# Patient Record
Sex: Female | Born: 1935 | Race: White | Hispanic: No | Marital: Married | Smoking: Never smoker
Health system: Southern US, Community
[De-identification: ages and names within clinical notes are randomized; demographics above are authoritative.]

## PROBLEM LIST (undated history)

## (undated) DIAGNOSIS — G2581 Restless legs syndrome: Secondary | ICD-10-CM

## (undated) DIAGNOSIS — D469 Myelodysplastic syndrome, unspecified: Secondary | ICD-10-CM

## (undated) DIAGNOSIS — H269 Unspecified cataract: Secondary | ICD-10-CM

## (undated) DIAGNOSIS — K3189 Other diseases of stomach and duodenum: Secondary | ICD-10-CM

## (undated) DIAGNOSIS — K760 Fatty (change of) liver, not elsewhere classified: Secondary | ICD-10-CM

## (undated) DIAGNOSIS — IMO0001 Reserved for inherently not codable concepts without codable children: Secondary | ICD-10-CM

## (undated) DIAGNOSIS — Z5189 Encounter for other specified aftercare: Secondary | ICD-10-CM

## (undated) DIAGNOSIS — I2510 Atherosclerotic heart disease of native coronary artery without angina pectoris: Secondary | ICD-10-CM

## (undated) DIAGNOSIS — R011 Cardiac murmur, unspecified: Secondary | ICD-10-CM

## (undated) DIAGNOSIS — K7460 Unspecified cirrhosis of liver: Secondary | ICD-10-CM

## (undated) DIAGNOSIS — E669 Obesity, unspecified: Secondary | ICD-10-CM

## (undated) DIAGNOSIS — K279 Peptic ulcer, site unspecified, unspecified as acute or chronic, without hemorrhage or perforation: Secondary | ICD-10-CM

## (undated) DIAGNOSIS — K219 Gastro-esophageal reflux disease without esophagitis: Secondary | ICD-10-CM

## (undated) DIAGNOSIS — K317 Polyp of stomach and duodenum: Secondary | ICD-10-CM

## (undated) DIAGNOSIS — D649 Anemia, unspecified: Secondary | ICD-10-CM

## (undated) DIAGNOSIS — M8580 Other specified disorders of bone density and structure, unspecified site: Secondary | ICD-10-CM

## (undated) DIAGNOSIS — I340 Nonrheumatic mitral (valve) insufficiency: Secondary | ICD-10-CM

## (undated) DIAGNOSIS — I509 Heart failure, unspecified: Secondary | ICD-10-CM

## (undated) DIAGNOSIS — H3530 Unspecified macular degeneration: Secondary | ICD-10-CM

## (undated) DIAGNOSIS — I10 Essential (primary) hypertension: Secondary | ICD-10-CM

## (undated) DIAGNOSIS — N189 Chronic kidney disease, unspecified: Secondary | ICD-10-CM

## (undated) DIAGNOSIS — E039 Hypothyroidism, unspecified: Secondary | ICD-10-CM

## (undated) DIAGNOSIS — R161 Splenomegaly, not elsewhere classified: Secondary | ICD-10-CM

## (undated) DIAGNOSIS — K766 Portal hypertension: Secondary | ICD-10-CM

## (undated) DIAGNOSIS — I8501 Esophageal varices with bleeding: Secondary | ICD-10-CM

## (undated) DIAGNOSIS — E785 Hyperlipidemia, unspecified: Secondary | ICD-10-CM

## (undated) HISTORY — PX: NEPHRECTOMY: SHX65

## (undated) HISTORY — DX: Obesity, unspecified: E66.9

## (undated) HISTORY — PX: CARDIAC CATHETERIZATION: SHX172

## (undated) HISTORY — DX: Unspecified cataract: H26.9

## (undated) HISTORY — DX: Polyp of stomach and duodenum: K31.7

## (undated) HISTORY — DX: Anemia, unspecified: D64.9

## (undated) HISTORY — DX: Essential (primary) hypertension: I10

## (undated) HISTORY — DX: Hypothyroidism, unspecified: E03.9

## (undated) HISTORY — DX: Hyperlipidemia, unspecified: E78.5

## (undated) HISTORY — DX: Fatty (change of) liver, not elsewhere classified: K76.0

## (undated) HISTORY — DX: Other specified disorders of bone density and structure, unspecified site: M85.80

## (undated) HISTORY — PX: PARTIAL NEPHRECTOMY: SHX414

## (undated) HISTORY — PX: OTHER SURGICAL HISTORY: SHX169

## (undated) HISTORY — DX: Unspecified cirrhosis of liver: K74.60

## (undated) HISTORY — PX: TONSILLECTOMY: SUR1361

## (undated) HISTORY — DX: Splenomegaly, not elsewhere classified: R16.1

## (undated) HISTORY — PX: BACK SURGERY: SHX140

## (undated) HISTORY — PX: ADRENALECTOMY: SHX876

## (undated) HISTORY — DX: Restless legs syndrome: G25.81

## (undated) HISTORY — PX: EYE SURGERY: SHX253

## (undated) HISTORY — DX: Atherosclerotic heart disease of native coronary artery without angina pectoris: I25.10

## (undated) HISTORY — DX: Chronic kidney disease, unspecified: N18.9

## (undated) HISTORY — DX: Nonrheumatic mitral (valve) insufficiency: I34.0

## (undated) HISTORY — DX: Peptic ulcer, site unspecified, unspecified as acute or chronic, without hemorrhage or perforation: K27.9

---
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
## 1958-08-27 HISTORY — PX: UMBILICAL HERNIA REPAIR: SHX196

## 1964-08-27 HISTORY — PX: TOTAL ABDOMINAL HYSTERECTOMY: SHX209

## 1968-08-27 HISTORY — PX: CHOLECYSTECTOMY: SHX55

## 1978-08-27 DIAGNOSIS — E785 Hyperlipidemia, unspecified: Secondary | ICD-10-CM

## 1978-08-27 HISTORY — DX: Hyperlipidemia, unspecified: E78.5

## 1983-08-28 DIAGNOSIS — E039 Hypothyroidism, unspecified: Secondary | ICD-10-CM

## 1983-08-28 HISTORY — DX: Hypothyroidism, unspecified: E03.9

## 1989-08-27 DIAGNOSIS — I10 Essential (primary) hypertension: Secondary | ICD-10-CM

## 1989-08-27 HISTORY — DX: Essential (primary) hypertension: I10

## 2001-06-27 DIAGNOSIS — I2510 Atherosclerotic heart disease of native coronary artery without angina pectoris: Secondary | ICD-10-CM

## 2001-06-27 HISTORY — DX: Atherosclerotic heart disease of native coronary artery without angina pectoris: I25.10

## 2001-08-01 ENCOUNTER — Inpatient Hospital Stay (HOSPITAL_COMMUNITY): Admission: RE | Admit: 2001-08-01 | Discharge: 2001-08-06 | Payer: Self-pay | Admitting: Cardiology

## 2001-08-01 ENCOUNTER — Encounter: Payer: Self-pay | Admitting: Cardiology

## 2002-02-23 ENCOUNTER — Ambulatory Visit (HOSPITAL_COMMUNITY): Admission: RE | Admit: 2002-02-23 | Discharge: 2002-02-23 | Payer: Self-pay | Admitting: Internal Medicine

## 2002-08-25 ENCOUNTER — Inpatient Hospital Stay (HOSPITAL_COMMUNITY): Admission: EM | Admit: 2002-08-25 | Discharge: 2002-08-28 | Payer: Self-pay

## 2002-08-27 DIAGNOSIS — R161 Splenomegaly, not elsewhere classified: Secondary | ICD-10-CM

## 2002-08-27 HISTORY — DX: Splenomegaly, not elsewhere classified: R16.1

## 2002-09-18 ENCOUNTER — Encounter: Admission: RE | Admit: 2002-09-18 | Discharge: 2002-09-18 | Payer: Self-pay | Admitting: Family Medicine

## 2002-09-29 ENCOUNTER — Inpatient Hospital Stay (HOSPITAL_COMMUNITY): Admission: AD | Admit: 2002-09-29 | Discharge: 2002-10-03 | Payer: Self-pay | Admitting: Family Medicine

## 2003-05-13 ENCOUNTER — Ambulatory Visit (HOSPITAL_COMMUNITY): Admission: RE | Admit: 2003-05-13 | Discharge: 2003-05-13 | Payer: Self-pay | Admitting: Gastroenterology

## 2004-07-27 DIAGNOSIS — G2581 Restless legs syndrome: Secondary | ICD-10-CM

## 2004-07-27 HISTORY — DX: Restless legs syndrome: G25.81

## 2005-08-27 DIAGNOSIS — M8580 Other specified disorders of bone density and structure, unspecified site: Secondary | ICD-10-CM

## 2005-08-27 HISTORY — DX: Other specified disorders of bone density and structure, unspecified site: M85.80

## 2006-06-05 ENCOUNTER — Ambulatory Visit: Payer: Self-pay | Admitting: Internal Medicine

## 2006-06-28 ENCOUNTER — Ambulatory Visit (HOSPITAL_COMMUNITY): Admission: RE | Admit: 2006-06-28 | Discharge: 2006-06-28 | Payer: Self-pay | Admitting: Internal Medicine

## 2006-06-28 ENCOUNTER — Ambulatory Visit: Payer: Self-pay | Admitting: Internal Medicine

## 2006-07-08 LAB — LACTATE DEHYDROGENASE: LDH: 132 U/L (ref 94–250)

## 2006-07-08 LAB — CBC WITH DIFFERENTIAL/PLATELET
Eosinophils Absolute: 0 10*3/uL (ref 0.0–0.5)
HCT: 32.1 % — ABNORMAL LOW (ref 34.8–46.6)
LYMPH%: 18.9 % (ref 14.0–48.0)
MONO#: 0.3 10*3/uL (ref 0.1–0.9)
NEUT#: 2 10*3/uL (ref 1.5–6.5)
NEUT%: 71 % (ref 39.6–76.8)
Platelets: 69 10*3/uL — ABNORMAL LOW (ref 145–400)
RBC: 3.19 10*6/uL — ABNORMAL LOW (ref 3.70–5.32)
WBC: 2.8 10*3/uL — ABNORMAL LOW (ref 3.9–10.0)
lymph#: 0.5 10*3/uL — ABNORMAL LOW (ref 0.9–3.3)

## 2008-07-02 HISTORY — PX: UPPER GASTROINTESTINAL ENDOSCOPY: SHX188

## 2009-02-11 HISTORY — PX: UPPER GASTROINTESTINAL ENDOSCOPY: SHX188

## 2010-10-11 ENCOUNTER — Institutional Professional Consult (permissible substitution) (INDEPENDENT_AMBULATORY_CARE_PROVIDER_SITE_OTHER): Payer: Medicare Other | Admitting: Cardiology

## 2010-10-11 DIAGNOSIS — R011 Cardiac murmur, unspecified: Secondary | ICD-10-CM

## 2010-10-11 DIAGNOSIS — I509 Heart failure, unspecified: Secondary | ICD-10-CM

## 2010-10-11 DIAGNOSIS — E119 Type 2 diabetes mellitus without complications: Secondary | ICD-10-CM

## 2010-10-25 ENCOUNTER — Other Ambulatory Visit (HOSPITAL_COMMUNITY): Payer: Medicare Other

## 2010-11-06 ENCOUNTER — Ambulatory Visit (HOSPITAL_COMMUNITY): Payer: Medicare Other | Attending: Cardiology

## 2010-11-06 DIAGNOSIS — I519 Heart disease, unspecified: Secondary | ICD-10-CM | POA: Insufficient documentation

## 2010-11-06 DIAGNOSIS — E785 Hyperlipidemia, unspecified: Secondary | ICD-10-CM | POA: Insufficient documentation

## 2010-11-06 DIAGNOSIS — E119 Type 2 diabetes mellitus without complications: Secondary | ICD-10-CM | POA: Insufficient documentation

## 2010-11-06 DIAGNOSIS — I509 Heart failure, unspecified: Secondary | ICD-10-CM | POA: Insufficient documentation

## 2010-11-06 DIAGNOSIS — I059 Rheumatic mitral valve disease, unspecified: Secondary | ICD-10-CM | POA: Insufficient documentation

## 2010-11-06 DIAGNOSIS — N289 Disorder of kidney and ureter, unspecified: Secondary | ICD-10-CM | POA: Insufficient documentation

## 2010-11-06 DIAGNOSIS — R0602 Shortness of breath: Secondary | ICD-10-CM | POA: Insufficient documentation

## 2010-11-13 ENCOUNTER — Ambulatory Visit (INDEPENDENT_AMBULATORY_CARE_PROVIDER_SITE_OTHER): Payer: Self-pay | Admitting: Internal Medicine

## 2010-11-21 ENCOUNTER — Ambulatory Visit (INDEPENDENT_AMBULATORY_CARE_PROVIDER_SITE_OTHER): Payer: Medicare Other | Admitting: Internal Medicine

## 2010-11-21 DIAGNOSIS — R188 Other ascites: Secondary | ICD-10-CM

## 2010-12-03 NOTE — Consult Note (Signed)
NAME:  Sara [name], Sara [name]             ACCOUNT NO.:  [id number]  MEDICAL RECORD NO.:  [id number]           PATIENT TYPE:  LOCATION:                                 FACILITY:  PHYSICIAN:  Lionel December, M.D.    DATE OF BIRTH:  August 29, 1924  DATE OF CONSULTATION: DATE OF DISCHARGE:                                CONSULTATION   HISTORY OF PRESENT ILLNESS:  Sara [name] is an 77 year old female referred to our office by Belva Agee at [facility] for ascites.  Sara [name] is actually a patient of ours and her last visit was in August 2011. Sara [name] was seen by me for followup of her recent GI bleed.  Sara [name] was admitted to [facility] and was transfused.  Her hemoglobin on April 11, 2010, was 10.6.  Sara [name] presents today with complaints of her ankle swelling and abdominal swelling.  Sara [name] recently underwent an ultrasound of the abdomen which revealed a stable moderate upper abdominal ascites.  Stable cirrhosis with splenomegaly and interval 1.2 cm probable superior splenic hemangioma or hemorrhagic cyst.  Her weight on October 02, 2009, was 196, today her weight is 218.  On April 13, 2010, her weight was 186.  Sara [name] has a history of cirrhosis and also Sara [name] has a history of esophageal varices secondary to cirrhosis, possibly due to a fatty liver.  Sara [name] underwent an EGD in June 2010.  Sara [name] had three columns of grade 2 esophageal varices without stigmata of bleeding.  They were slightly more prominent compared to previous exam of November 2009.  Recent labs that Sara [name] has had her BUN elevated at 55, her creatinine at 2.0.  Sodium 143, K 4.3, chloride 107, calcium 8.7 and BMP 112.7.  Sara [name] says her appetite is okay.  Her bowel movements are normal.  There has been no change in her appetite.  Shakeila also has a history of anemia associated with myelodysplastic syndrome.  Sara [name] is allergic to NSAIDS, SULFA and ASPIRIN.  HOME MEDICATIONS: 1. Lantus 25 units a day. 2. Clonidine 0.3 mg twice a day. 3.  Sara [name] is on Lasix 40 mg twice a day. 4. Amlodipine 10 mg a day. 5. Allopurinol 300 mg a day. 6. Simvastatin 10 mg a day. 7. Levothyroxine 200 mcg a day. 8. Sara [name] is on vitamin B12 1000 mcg. 9. PreserVision twice a day. 10.Fish oil 1000 mg a day. 11.Prilosec OTC 1 a day. 12.Glyburide 4 mg twice a day. 13.D3 1 a day. 14.MVI 1 a day. 15.Sara [name] is also on Coreg 25 mg twice a day.  OBJECTIVE:  HEENT:  Her conjunctivae are pink.  Her sclerae are anicteric.  Her thyroid is normal. NECK:  There is no cervical lymphadenopathy. LUNGS:  Clear. HEART:  Regular rate and rhythm. ABDOMEN:  Distended but not tense. EXTREMITIES:  Sara [name] has 4+ edema to her lower extremities and appears Sara [name] has some cellulitis too.  ASSESSMENT:  Jaylenne is an 77 year old female with nonalcoholic cirrhosis.  Sara [name] does have some distention today.  Her abdomen is not tense.  Sara [name] is on Lasix 40 mg b.i.d.  I did discuss this case with Dr. Karilyn Cota.  Would like to get  a Chem 20 on her today, CBC, PT/INR and alpha- fetoprotein.  If her BUN and creatinine are normal, we will start her on spirolactone 50 mg twice a day.  I will give her a prescription for this.  Sara [name] is not to take it till after I get the lab works back.  Sara [name] is to stay on the Lasix twice a day.  Further recommendations once I have her lab work back and Sara [name] will follow up in 3 months.    ______________________________ Dorene Ar, NP   ______________________________ Lionel December, M.D.    TS/MEDQ  D:  11/22/2010  T:  11/23/2010  Job:  [id number]  Electronically Signed by Dorene Ar PA on 11/29/2010 04:51:15 PM Electronically Signed by Lionel December M.D. on 12/03/2010 01:56:53 PM

## 2010-12-26 ENCOUNTER — Ambulatory Visit (INDEPENDENT_AMBULATORY_CARE_PROVIDER_SITE_OTHER): Payer: Medicare Other | Admitting: Internal Medicine

## 2011-01-12 ENCOUNTER — Encounter: Payer: Self-pay | Admitting: Nurse Practitioner

## 2011-02-13 ENCOUNTER — Other Ambulatory Visit (INDEPENDENT_AMBULATORY_CARE_PROVIDER_SITE_OTHER): Payer: Self-pay | Admitting: Internal Medicine

## 2011-02-13 ENCOUNTER — Ambulatory Visit (INDEPENDENT_AMBULATORY_CARE_PROVIDER_SITE_OTHER): Payer: Medicare Other | Admitting: Internal Medicine

## 2011-02-13 DIAGNOSIS — E8779 Other fluid overload: Secondary | ICD-10-CM

## 2011-02-13 DIAGNOSIS — K7460 Unspecified cirrhosis of liver: Secondary | ICD-10-CM

## 2011-02-13 DIAGNOSIS — K921 Melena: Secondary | ICD-10-CM

## 2011-02-19 LAB — CBC AND DIFFERENTIAL
Hemoglobin: 9 g/dL — AB (ref 12.0–16.0)
Platelets: 50 10*3/uL — AB (ref 150–399)

## 2011-03-25 LAB — HEPATIC FUNCTION PANEL
ALT: 11 U/L (ref 7–35)
AST: 13 U/L (ref 13–35)

## 2011-04-11 ENCOUNTER — Encounter (INDEPENDENT_AMBULATORY_CARE_PROVIDER_SITE_OTHER): Payer: Self-pay

## 2011-06-04 ENCOUNTER — Ambulatory Visit (INDEPENDENT_AMBULATORY_CARE_PROVIDER_SITE_OTHER): Payer: Medicare Other | Admitting: Internal Medicine

## 2011-06-04 ENCOUNTER — Encounter (INDEPENDENT_AMBULATORY_CARE_PROVIDER_SITE_OTHER): Payer: Self-pay | Admitting: Internal Medicine

## 2011-06-04 DIAGNOSIS — K7460 Unspecified cirrhosis of liver: Secondary | ICD-10-CM

## 2011-06-04 DIAGNOSIS — D649 Anemia, unspecified: Secondary | ICD-10-CM

## 2011-06-04 NOTE — Progress Notes (Signed)
Presenting complaint; followup for cirrhosis and anemia with history of GI bleed; Subjective; patient is 77 year old Caucasian female who is therefore scheduled visit. She was last seen on 02/14/2011. She states she is doing well. She just had her 86th birthday yesterday. She has gained 5 pounds since her last visit. She denies abdominal distention but has noted no edema to her lower extremities. He denies PND or orthopnea. She has a good appetite. Her bowels move daily. She denies melena or rectal bleeding nausea vomiting or heartburn. She checks her blood glucose level once or twice daily and her fasting is low 100 range. She says her last hemoglobin A1c was 5.2%. She is still driving. He has macular degeneration in her condition actually has improved Current Outpatient Prescriptions  Medication Sig Dispense Refill  . allopurinol (ZYLOPRIM) 100 MG tablet Take 300 mg by mouth. Take 1/2 tablet daily      . amLODipine (NORVASC) 10 MG tablet Take 10 mg by mouth daily.        . carvedilol (COREG) 25 MG tablet Take 25 mg by mouth 2 (two) times daily with a meal.        . Cholecalciferol (VITAMIN D) 2000 UNITS CAPS Take by mouth every evening.        . cloNIDine (CATAPRES) 0.3 MG tablet Take 0.3 mg by mouth 2 (two) times daily.        . folic acid (FOLVITE) 1 MG tablet Take 1 mg by mouth every morning.        . furosemide (LASIX) 40 MG tablet Take 40 mg by mouth daily.       Marland Kitchen glimepiride (AMARYL) 4 MG tablet Take 4 mg by mouth daily before breakfast.        . insulin glargine (LANTUS) 100 UNIT/ML injection Inject 20 Units into the skin at bedtime.       Marland Kitchen levothyroxine (SYNTHROID, LEVOTHROID) 200 MCG tablet Take 200 mcg by mouth daily.        . Multiple Vitamins-Minerals (CENTRUM SILVER PO) Take by mouth every morning.        . Multiple Vitamins-Minerals (PRESERVISION AREDS PO) Take by mouth 3 (three) times daily - between meals and at bedtime.        . Omega-3 Fatty Acids (FISH OIL) 1000 MG CAPS Take  by mouth every morning.        Marland Kitchen omeprazole (PRILOSEC OTC) 20 MG tablet Take 20 mg by mouth daily.        . simvastatin (ZOCOR) 20 MG tablet Take 10 mg by mouth. Take 1/2 tablet at bedtime      . vitamin B-12 (CYANOCOBALAMIN) 1000 MCG tablet Take 1,000 mcg by mouth every morning.         Objective BP 124/70  Pulse 76  Temp(Src) 97.3 F (36.3 C) (Oral)  Resp 14  Ht 5\' 3"  (1.6 m)  Wt 230 lb (104.327 kg)  BMI 40.74 kg/m2 She is alert and does not have asterixis. Conjunctiva is pink. Sclerae nonicteric. Oropharyngeal mucosa is normal. No neck masses or thyromegaly noted. Cardiac exam with regular rhythm normal S1 and S2. He has grade 2/6 systolic ejection murmur best heard at LLSB anuric area. Lungs are clear to auscultation. Abdomen is protuberant liver edge is 3-4 cm below RCM but spleen is not palpable. No shifting dullness noted He has 1-2+ pitting edema involving her lower extremities low the level of knees Lab data From 05/31/2011 creatinine 2.134 bilirubin oh 0.4 AP 112, AST 24, ALT  15, albumin 3.4 Ultrasound from 02/26/2011 reveals cirrhotic appearing liver with small amount of ascites and splenomegaly; no focal abnormalities noted to liver. Alpha-fetoprotein in March 2012 was 2.1 Assessment #1 cirrhosis. She appears to be in a holding pattern. Her ascites does not appear to be have increased; she unfortunately is not a candidate for spironolactone due to chronic kidney disease. His ascites becomes and issue she will need periodic abdominal tap. #2. History of anemia felt to be multifactorial including history of GI bleed. He is closely being followed by Dr. Ubaldo Glassing. Last hemoglobin was 11. Recommendations No changes made in her medications She needs to make sure he does not gain any more weight. She will call us if her weight goes above 205 pounds on her scale or if she develops abdominal distention otherwise she will return for office visit in 6 months.

## 2011-06-04 NOTE — Patient Instructions (Addendum)
No changes made to your medications today. Call if your weight reaches 205 lbs on your scale or if abdominal distension increases

## 2011-07-25 ENCOUNTER — Ambulatory Visit (INDEPENDENT_AMBULATORY_CARE_PROVIDER_SITE_OTHER): Payer: Medicare Other | Admitting: Internal Medicine

## 2011-07-25 ENCOUNTER — Encounter (INDEPENDENT_AMBULATORY_CARE_PROVIDER_SITE_OTHER): Payer: Self-pay | Admitting: Internal Medicine

## 2011-07-25 ENCOUNTER — Telehealth (INDEPENDENT_AMBULATORY_CARE_PROVIDER_SITE_OTHER): Payer: Self-pay | Admitting: *Deleted

## 2011-07-25 DIAGNOSIS — E7800 Pure hypercholesterolemia, unspecified: Secondary | ICD-10-CM

## 2011-07-25 DIAGNOSIS — E039 Hypothyroidism, unspecified: Secondary | ICD-10-CM

## 2011-07-25 DIAGNOSIS — E119 Type 2 diabetes mellitus without complications: Secondary | ICD-10-CM | POA: Insufficient documentation

## 2011-07-25 DIAGNOSIS — K922 Gastrointestinal hemorrhage, unspecified: Secondary | ICD-10-CM | POA: Insufficient documentation

## 2011-07-25 DIAGNOSIS — I10 Essential (primary) hypertension: Secondary | ICD-10-CM

## 2011-07-25 DIAGNOSIS — K7460 Unspecified cirrhosis of liver: Secondary | ICD-10-CM

## 2011-07-25 DIAGNOSIS — K769 Liver disease, unspecified: Secondary | ICD-10-CM

## 2011-07-25 NOTE — Progress Notes (Signed)
Subjective:     Patient ID: Sara [name], female   DOB: 1925-08-15, 77 y.o.   MRN: [id number]  HPIFrances is a 77 yr old female presenting today with c/o that her abdomen is distended. She says when she lies down she cannot breath. She says she wheezes.  She says she has been taking an extra Lasix most every day due to her abdominal and extremitiy swelling. Sara [name] has a history of cirrhosis complicated by ascites and fluid retention. She also has a history of GI bleed secondary to PUD.  She was last seen in June of this by Dr. Karilyn Cota. Her weight was 198. She is up to 204 today. She is watching her salt intake. Appetite is good.  Her bowel are moving normal. No melena or bright red rectal bleeding.  She has known esophageal varices, grade 2 as well as portal gastropathy. Her last exam was June 20, of 2010.  05/31/2011 NA 144, K 4.3, Chloride 109, Creatinine 2.48, ALP 112, AST 24, ALT 15,  02/19/2011 H and H 9.0 and 28, Platelet ct 50. 02/2011 US abdomen: Cirrhosis with a small cystic lesion, possible representing a minimally complex cyst, as before. Splenomegaly. Ascites.  Review of Systems see hpi Current Outpatient Prescriptions  Medication Sig Dispense Refill  . allopurinol (ZYLOPRIM) 100 MG tablet Take 300 mg by mouth. Take 1/2 tablet daily      . amLODipine (NORVASC) 10 MG tablet Take 10 mg by mouth daily.        . carvedilol (COREG) 25 MG tablet Take 25 mg by mouth 2 (two) times daily with a meal.        . Cholecalciferol (VITAMIN D) 2000 UNITS CAPS Take by mouth every evening.        . cloNIDine (CATAPRES) 0.3 MG tablet Take 0.3 mg by mouth 2 (two) times daily.        . folic acid (FOLVITE) 1 MG tablet Take 1 mg by mouth every morning.        . furosemide (LASIX) 40 MG tablet Take 40 mg by mouth daily.       Marland Kitchen glimepiride (AMARYL) 4 MG tablet Take 4 mg by mouth daily before breakfast.        . insulin glargine (LANTUS) 100 UNIT/ML injection Inject 20 Units into the skin at bedtime.        Marland Kitchen levothyroxine (SYNTHROID, LEVOTHROID) 200 MCG tablet Take 200 mcg by mouth daily.        . Multiple Vitamins-Minerals (CENTRUM SILVER PO) Take by mouth every morning.        . Multiple Vitamins-Minerals (PRESERVISION AREDS PO) Take by mouth 3 (three) times daily - between meals and at bedtime.        . Omega-3 Fatty Acids (FISH OIL) 1000 MG CAPS Take by mouth every morning.        Marland Kitchen omeprazole (PRILOSEC OTC) 20 MG tablet Take 20 mg by mouth daily.        . simvastatin (ZOCOR) 20 MG tablet Take 10 mg by mouth. Take 1/2 tablet at bedtime      . vitamin B-12 (CYANOCOBALAMIN) 1000 MCG tablet Take 1,000 mcg by mouth every morning.         Past Medical History  Diagnosis Date  . Hypertension 1991  . Hypothyroidism 1985  . Hyperlipidemia 1980  . Chronic anemia   . Gouty arthritis   . Cataracts, bilateral   . CAD (coronary artery disease) 06/2001  . Chronic renal failure   .  Diverticula of colon     (L)  . Hemorrhoids   . Hematuria 03/2002  . Splenomegaly 2004  . Diabetes mellitus 1995  . Mitral valve regurgitation   . Restless leg syndrome 12/05  . PUD (peptic ulcer disease)   . Osteopenia 2007    leftt hip   . Hyperparathyroidism   . Gastric polyps   . Cirrhosis   . Obesity   . Fatty liver   . GI bleed    Past Surgical History  Procedure Date  . Total abdominal hysterectomy 1966    fibroids   . Umbilical hernia repair 1960  . L-s bal surgery hemorrhoids 1984 & 1986  . Cholecystectomy 1970  . Appendectomy 1958  . Tonsillectomy   . Partial nephrectomy     right  . Upper gastrointestinal endoscopy 02/11/2009  . Upper gastrointestinal endoscopy 07/02/08   History   Social History  . Marital Status: Married    Spouse Name: N/A    Number of Children: N/A  . Years of Education: N/A   Occupational History  . Not on file.   Social History Main Topics  . Smoking status: Never Smoker   . Smokeless tobacco: Never Used  . Alcohol Use: No  . Drug Use: No  . Sexually  Active: Not on file   Other Topics Concern  . Not on file   Social History Narrative  . No narrative on file   Family Status  Relation Status Death Age  . Mother Deceased 71    old age  . Father Deceased 72  . Sister Alive   . Brother Alive     back problems  . Sister Alive   . Son Alive   . Son Alive    Allergies  Allergen Reactions  . Aspirin Adult Low (Aspirin)   . Nsaids   . Sulfa Antibiotics        Objective:   Physical Exam Filed Vitals:   07/25/11 1046  Height: 5\' 3"  (1.6 m)  Weight: 204 lb 11.2 oz (92.851 kg)    Alert and oriented. Skin warm and dry. Oral mucosa is moist. Natural teeth in good condition. Sclera anicteric, conjunctivae is pink. Thyroid not enlarged. No cervical lymphadenopathy.Bilateral wheezes. Heart regular rate and rhythm.  Abdomen is soft. Bowel sounds are positive.  No abdominal masses felt. No tenderness. Unable to palpate liver.  4+ edema to lower extremities with redness which appears to bell cellulitis. Patient is alert and oriented.      Assessment:    Cirrhosis. Fluid overload. 4+ pitting edema to lower extremities.     Plan:     Increase Lasix to 3 times a day. C met today and in 2 weeks.

## 2011-07-25 NOTE — Telephone Encounter (Signed)
Per Loney Loh written order, the patient will neea C-Met in 2 weeks. Increased Lasix to 3 times daily.

## 2011-07-25 NOTE — Patient Instructions (Signed)
cmet today and in 2 weeks. Will increase lasix to 3 times a day and then drop back in 2 weeks. C met in 2 weeks. OV in 3 months

## 2011-08-09 NOTE — Telephone Encounter (Signed)
She did not have lab drawn. Called her today and she will have lab drawn tomorrow. 08/10/2011

## 2011-08-09 NOTE — Progress Notes (Signed)
She did not have lab drawn. Will have lab drawn tomorrow. (08/10/2011)

## 2011-08-10 ENCOUNTER — Other Ambulatory Visit (INDEPENDENT_AMBULATORY_CARE_PROVIDER_SITE_OTHER): Payer: Self-pay | Admitting: Internal Medicine

## 2011-08-11 LAB — COMPREHENSIVE METABOLIC PANEL
ALT: 12 U/L (ref 0–35)
Albumin: 3.4 g/dL — ABNORMAL LOW (ref 3.5–5.2)
CO2: 24 mEq/L (ref 19–32)
Calcium: 8.6 mg/dL (ref 8.4–10.5)
Chloride: 107 mEq/L (ref 96–112)
Creat: 2.35 mg/dL — ABNORMAL HIGH (ref 0.50–1.10)
Potassium: 4.7 mEq/L (ref 3.5–5.3)
Total Protein: 5.2 g/dL — ABNORMAL LOW (ref 6.0–8.3)

## 2011-08-11 LAB — CBC
HCT: 32.8 % — ABNORMAL LOW (ref 36.0–46.0)
MCHC: 31.7 g/dL (ref 30.0–36.0)
RDW: 16.6 % — ABNORMAL HIGH (ref 11.5–15.5)
WBC: 2.3 10*3/uL — ABNORMAL LOW (ref 4.0–10.5)

## 2011-09-28 HISTORY — PX: OTHER SURGICAL HISTORY: SHX169

## 2011-10-01 ENCOUNTER — Other Ambulatory Visit: Payer: Self-pay | Admitting: Cardiology

## 2011-10-01 NOTE — Telephone Encounter (Signed)
Fax Received. Refill Completed. Sara [name] (R.M.A)  Pt needs appointment then refill can be made  

## 2011-10-12 HISTORY — PX: US THORA/PARACENTESIS: HXRAD580

## 2011-10-15 DIAGNOSIS — R0602 Shortness of breath: Secondary | ICD-10-CM

## 2011-10-25 ENCOUNTER — Ambulatory Visit (INDEPENDENT_AMBULATORY_CARE_PROVIDER_SITE_OTHER): Payer: Medicare Other | Admitting: Internal Medicine

## 2011-10-25 ENCOUNTER — Other Ambulatory Visit: Payer: Self-pay | Admitting: Radiology

## 2011-10-26 ENCOUNTER — Other Ambulatory Visit (HOSPITAL_COMMUNITY): Payer: Self-pay | Admitting: Internal Medicine

## 2011-10-26 ENCOUNTER — Ambulatory Visit (HOSPITAL_COMMUNITY)
Admission: RE | Admit: 2011-10-26 | Discharge: 2011-10-26 | Disposition: A | Discharge status: Home / Self Care | Payer: Medicare Other | Source: Ambulatory Visit | Attending: Internal Medicine | Admitting: Internal Medicine

## 2011-10-26 DIAGNOSIS — E039 Hypothyroidism, unspecified: Secondary | ICD-10-CM | POA: Diagnosis not present

## 2011-10-26 DIAGNOSIS — E119 Type 2 diabetes mellitus without complications: Secondary | ICD-10-CM | POA: Diagnosis not present

## 2011-10-26 DIAGNOSIS — Y849 Medical procedure, unspecified as the cause of abnormal reaction of the patient, or of later complication, without mention of misadventure at the time of the procedure: Secondary | ICD-10-CM | POA: Diagnosis not present

## 2011-10-26 DIAGNOSIS — E785 Hyperlipidemia, unspecified: Secondary | ICD-10-CM | POA: Insufficient documentation

## 2011-10-26 DIAGNOSIS — N186 End stage renal disease: Secondary | ICD-10-CM

## 2011-10-26 DIAGNOSIS — I129 Hypertensive chronic kidney disease with stage 1 through stage 4 chronic kidney disease, or unspecified chronic kidney disease: Secondary | ICD-10-CM | POA: Diagnosis not present

## 2011-10-26 DIAGNOSIS — T82898A Other specified complication of vascular prosthetic devices, implants and grafts, initial encounter: Secondary | ICD-10-CM | POA: Diagnosis present

## 2011-10-26 DIAGNOSIS — N189 Chronic kidney disease, unspecified: Secondary | ICD-10-CM | POA: Diagnosis not present

## 2011-10-26 DIAGNOSIS — I2510 Atherosclerotic heart disease of native coronary artery without angina pectoris: Secondary | ICD-10-CM | POA: Diagnosis not present

## 2011-10-26 MED ORDER — CEFAZOLIN SODIUM 1-5 GM-% IV SOLN
INTRAVENOUS | Status: AC
  Filled 2011-10-26: qty 50

## 2011-10-26 MED ORDER — MIDAZOLAM HCL 2 MG/2ML IJ SOLN
INTRAMUSCULAR | Status: AC
  Filled 2011-10-26: qty 4

## 2011-10-26 MED ORDER — HEPARIN SODIUM (PORCINE) 1000 UNIT/ML IJ SOLN
INTRAMUSCULAR | Status: AC
  Filled 2011-10-26: qty 2

## 2011-10-26 MED ORDER — FENTANYL CITRATE 0.05 MG/ML IJ SOLN
INTRAMUSCULAR | Status: AC | PRN
  Administered 2011-10-26: 50 ug via INTRAVENOUS

## 2011-10-26 MED ORDER — CEFAZOLIN SODIUM 1-5 GM-% IV SOLN
1.0000 g | Freq: Once | INTRAVENOUS | Status: AC
  Administered 2011-10-26: 1 g via INTRAVENOUS

## 2011-10-26 MED ORDER — FENTANYL CITRATE 0.05 MG/ML IJ SOLN
INTRAMUSCULAR | Status: AC
  Filled 2011-10-26: qty 2

## 2011-10-26 MED ORDER — FENTANYL CITRATE 0.05 MG/ML IJ SOLN
INTRAMUSCULAR | Status: AC
  Filled 2011-10-26: qty 4

## 2011-10-26 MED ORDER — CHLORHEXIDINE GLUCONATE 4 % EX LIQD
CUTANEOUS | Status: AC
  Filled 2011-10-26: qty 30

## 2011-10-26 NOTE — H&P (Signed)
Sara [name] is an 78 y.o. female.   Chief Complaint: "I'm here for a new dialysis catheter" HPI: Patient with history of renal failure and nonfunctioning right IJ hemodialysis catheter presents today for placement of new catheter/catheter exchange.  Past Medical History  Diagnosis Date  . Hypertension 1991  . Hypothyroidism 1985  . Hyperlipidemia 1980  . Chronic anemia   . Gouty arthritis   . Cataracts, bilateral   . CAD (coronary artery disease) 06/2001  . Chronic renal failure   . Diverticula of colon     (L)  . Hemorrhoids   . Hematuria 03/2002  . Splenomegaly 2004  . Diabetes mellitus 1995  . Mitral valve regurgitation   . Restless leg syndrome 12/05  . PUD (peptic ulcer disease)   . Osteopenia 2007    leftt hip   . Hyperparathyroidism   . Gastric polyps   . Cirrhosis   . Obesity   . Fatty liver   . GI bleed     Past Surgical History  Procedure Date  . Total abdominal hysterectomy 1966    fibroids   . Umbilical hernia repair 1960  . L-s bal surgery hemorrhoids 1984 & 1986  . Cholecystectomy 1970  . Appendectomy 1958  . Tonsillectomy   . Partial nephrectomy     right  . Upper gastrointestinal endoscopy 02/11/2009  . Upper gastrointestinal endoscopy 07/02/08    Family History  Problem Relation Age of Onset  . Heart disease Father   . Diabetes Father   . Stroke Father   . Diabetes Sister   . Healthy Sister   . Healthy Son   . Healthy Son   . Healthy Daughter    Social History:  reports that she has never smoked. She has never used smokeless tobacco. She reports that she does not drink alcohol or use illicit drugs.  Allergies:  Allergies  Allergen Reactions  . Aspirin Adult Low (Aspirin)   . Nsaids   . Sulfa Antibiotics     Medications Prior to Admission  Medication Sig Dispense Refill  . allopurinol (ZYLOPRIM) 100 MG tablet Take 300 mg by mouth. Take 1/2 tablet daily      . amLODipine (NORVASC) 10 MG tablet Take 10 mg by mouth daily.         . carvedilol (COREG) 25 MG tablet TAKE ONE TABLET BY MOUTH TWICE DAILY  60 tablet  0  . Cholecalciferol (VITAMIN D) 2000 UNITS CAPS Take by mouth every evening.        . cloNIDine (CATAPRES) 0.3 MG tablet Take 0.3 mg by mouth 2 (two) times daily.        . folic acid (FOLVITE) 1 MG tablet Take 1 mg by mouth every morning.        . furosemide (LASIX) 40 MG tablet Take 40 mg by mouth daily.       Marland Kitchen glimepiride (AMARYL) 4 MG tablet Take 4 mg by mouth daily before breakfast.        . insulin glargine (LANTUS) 100 UNIT/ML injection Inject 20 Units into the skin at bedtime.       Marland Kitchen levothyroxine (SYNTHROID, LEVOTHROID) 200 MCG tablet Take 200 mcg by mouth daily.        . Multiple Vitamins-Minerals (CENTRUM SILVER PO) Take by mouth every morning.        . Multiple Vitamins-Minerals (PRESERVISION AREDS PO) Take by mouth 3 (three) times daily - between meals and at bedtime.        Marland Kitchen  Omega-3 Fatty Acids (FISH OIL) 1000 MG CAPS Take by mouth every morning.        Marland Kitchen omeprazole (PRILOSEC OTC) 20 MG tablet Take 20 mg by mouth daily.        . simvastatin (ZOCOR) 20 MG tablet Take 10 mg by mouth. Take 1/2 tablet at bedtime      . vitamin B-12 (CYANOCOBALAMIN) 1000 MCG tablet Take 1,000 mcg by mouth every morning.         Medications Prior to Admission  Medication Dose Route Frequency Provider Last Rate Last Dose  . ceFAZolin (ANCEF) IVPB 1 g/50 mL premix  1 g Intravenous Once Dayne Oley Balm III, MD        Results for orders placed during the hospital encounter of 10/26/11 (from the past 48 hour(s))  PREPARE PLATELET PHERESIS     Status: Normal (Preliminary result)   Collection Time   10/26/11  3:53 PM      Component Value Range Comment   Unit Number [id number]      Blood Component Type PLTPHER LR3      Unit division 00      Status of Unit ISSUED      Transfusion Status OK TO TRANSFUSE     Labs: 10/25/11   HGB 10.0  PLTS  45k      K 4.8  PT  16.4  INR 1.3 Filed Vitals:   10/26/11 0700  BP: 144/64    Pulse: 80  Temp: 98.2 F (36.8 C)  SpO2: 90%    Review of Systems  Constitutional: Negative for fever and chills.  Respiratory: Positive for cough and shortness of breath.   Cardiovascular: Negative for chest pain.  Gastrointestinal: Positive for nausea and vomiting. Negative for abdominal pain.  Neurological: Negative for headaches.  Endo/Heme/Allergies: Does not bruise/bleed easily.    Blood pressure 144/64, pulse 80, temperature 98.2 F (36.8 C), SpO2 90.00%. Physical Exam  Constitutional: She is oriented to person, place, and time. She appears well-developed and well-nourished.  Cardiovascular: Normal rate and regular rhythm.   Murmur heard. Respiratory: Effort normal.       Bibasilar crackles  GI: Soft. Bowel sounds are normal. She exhibits distension. There is no tenderness.  Musculoskeletal: Normal range of motion.  Neurological: She is alert and oriented to person, place, and time.     Assessment/Plan Patient with renal failure and nonfunctioning right IJ hemodialysis catheter; plan is for new HD catheter placement vs exchange. Patient to receive platelet transfusion preprocedure.  Antonina Deziel,D KEVIN 10/26/2011, 9:18 AM

## 2011-10-26 NOTE — Procedures (Signed)
Exchange and revise R IJ HD Catheter No complication No blood loss. See complete dictation in [facility].

## 2011-10-26 NOTE — ED Notes (Signed)
Report called to [facility] Room 322 Nurse, 322.  Spoke with Otis Peak, RN - pt's primary nurse.

## 2011-10-27 LAB — PREPARE PLATELET PHERESIS: Unit division: 0

## 2011-11-01 ENCOUNTER — Telehealth (HOSPITAL_COMMUNITY): Payer: Self-pay

## 2011-11-27 ENCOUNTER — Ambulatory Visit: Payer: Medicare Other | Admitting: Vascular Surgery

## 2011-11-29 ENCOUNTER — Encounter (INDEPENDENT_AMBULATORY_CARE_PROVIDER_SITE_OTHER): Payer: Self-pay | Admitting: *Deleted

## 2011-12-06 ENCOUNTER — Encounter: Payer: Self-pay | Admitting: Vascular Surgery

## 2011-12-07 ENCOUNTER — Ambulatory Visit (INDEPENDENT_AMBULATORY_CARE_PROVIDER_SITE_OTHER): Payer: Medicare Other | Admitting: Vascular Surgery

## 2011-12-07 ENCOUNTER — Encounter: Payer: Self-pay | Admitting: Vascular Surgery

## 2011-12-07 ENCOUNTER — Ambulatory Visit (INDEPENDENT_AMBULATORY_CARE_PROVIDER_SITE_OTHER): Payer: Medicare Other | Admitting: *Deleted

## 2011-12-07 VITALS — BP 147/67 | HR 88 | Resp 18 | Ht 63.0 in | Wt 161.0 lb

## 2011-12-07 DIAGNOSIS — N186 End stage renal disease: Secondary | ICD-10-CM | POA: Insufficient documentation

## 2011-12-07 DIAGNOSIS — Z01810 Encounter for preprocedural cardiovascular examination: Secondary | ICD-10-CM

## 2011-12-07 DIAGNOSIS — Z48812 Encounter for surgical aftercare following surgery on the circulatory system: Secondary | ICD-10-CM

## 2011-12-07 NOTE — Progress Notes (Signed)
[facility]  Referred by:  Jamse Mead, MD [address] [place], [place] [address]  Reason for referral: New access  History of Present Illness  Sara [name] is a 78 y.o. (02-11-1925) female who presents for evaluation for permanent access.  The patient is right hand dominant.  The patient has not had previous access procedures except recent TDC.  Previous central venous cannulation procedures include: RIJ TDC.  The patient has never had a PPM placed.   Past Medical History  Diagnosis Date  . Hypertension 1991  . Hypothyroidism 1985  . Hyperlipidemia 1980  . Chronic anemia   . Gouty arthritis   . Cataracts, bilateral   . CAD (coronary artery disease) 06/2001  . Chronic renal failure   . Diverticula of colon     (L)  . Hemorrhoids   . Hematuria 03/2002  . Splenomegaly 2004  . Diabetes mellitus 1995  . Mitral valve regurgitation   . Restless leg syndrome 12/05  . PUD (peptic ulcer disease)   . Osteopenia 2007    leftt hip   . Hyperparathyroidism   . Gastric polyps   . Cirrhosis   . Obesity   . Fatty liver   . GI bleed     Past Surgical History  Procedure Date  . Total abdominal hysterectomy 1966    fibroids   . Umbilical hernia repair 1960  . L-s bal surgery hemorrhoids 1984 & 1986  . Cholecystectomy 1970  . Appendectomy 1958  . Tonsillectomy   . Partial nephrectomy     right  . Upper gastrointestinal endoscopy 02/11/2009  . Upper gastrointestinal endoscopy 07/02/08    History   Social History  . Marital Status: Married    Spouse Name: N/A    Number of Children: N/A  . Years of Education: N/A   Occupational History  . Not on file.   Social History Main Topics  . Smoking status: Never Smoker   . Smokeless tobacco: Never Used  . Alcohol Use: No  . Drug Use: No  . Sexually Active: Not on file   Other Topics Concern  . Not on file   Social History Narrative  . No narrative on file    Family  History  Problem Relation Age of Onset  . Heart disease Father   . Diabetes Father   . Stroke Father   . Diabetes Sister   . Healthy Sister   . Healthy Son   . Healthy Son   . Healthy Daughter     Current Outpatient Prescriptions on File Prior to Visit  Medication Sig Dispense Refill  . allopurinol (ZYLOPRIM) 100 MG tablet Take 300 mg by mouth. Take 1/2 tablet daily      . amLODipine (NORVASC) 10 MG tablet Take 10 mg by mouth daily.        . carvedilol (COREG) 25 MG tablet TAKE ONE TABLET BY MOUTH TWICE DAILY  60 tablet  0  . Cholecalciferol (VITAMIN D) 2000 UNITS CAPS Take by mouth every evening.        . cloNIDine (CATAPRES) 0.3 MG tablet Take 0.3 mg by mouth 2 (two) times daily.        . folic acid (FOLVITE) 1 MG tablet Take 1 mg by mouth every morning.        Marland Kitchen glimepiride (AMARYL) 4 MG tablet Take 4 mg by mouth daily before breakfast.        . insulin glargine (LANTUS) 100 UNIT/ML  injection Inject 20 Units into the skin at bedtime.       Marland Kitchen levothyroxine (SYNTHROID, LEVOTHROID) 200 MCG tablet Take 200 mcg by mouth daily.        . Multiple Vitamins-Minerals (CENTRUM SILVER PO) Take by mouth every morning.        . Multiple Vitamins-Minerals (PRESERVISION AREDS PO) Take by mouth 3 (three) times daily - between meals and at bedtime.        . Omega-3 Fatty Acids (FISH OIL) 1000 MG CAPS Take by mouth every morning.        Marland Kitchen omeprazole (PRILOSEC OTC) 20 MG tablet Take 20 mg by mouth daily.        . simvastatin (ZOCOR) 20 MG tablet Take 10 mg by mouth. Take 1/2 tablet at bedtime      . torsemide (DEMADEX) 20 MG tablet Take 20 mg by mouth 3 (three) times daily.      . vitamin B-12 (CYANOCOBALAMIN) 1000 MCG tablet Take 1,000 mcg by mouth every morning.        . furosemide (LASIX) 40 MG tablet Take 40 mg by mouth daily.       Marland Kitchen NOVOLOG 100 UNIT/ML injection         Allergies  Allergen Reactions  . Aspirin Adult Low (Aspirin)   . Nsaids   . Sulfa Antibiotics     REVIEW OF SYSTEMS:   (Positives indicated with an "x", otherwise negative)  CARDIOVASCULAR: [ ]  chest pain    [ ]  chest pressure    [ ]  palpitations    [ ]  orthopnea   [ ]  dyspnea on exert. [ ]  claudication    [ ]  rest pain     [ ]  DVT     [ ]  phlebitis [x]  CHF  PULMONARY:    [ ]  productive cough [ ]  asthma  [ ]  wheezing  NEUROLOGIC:    [ ]  weakness    [ ]  paresthesias   [ ]  aphasia    [ ]  amaurosis    [ ]  dizziness  HEMATOLOGIC:    [ ]  bleeding problems  [ ]  clotting disorders [x]  anemia  MUSCULOSKEL: [ ]  joint pain     [ ]  joint swelling  GASTROINTEST:  [ ]   blood in stool   [ ]   hematemesis  GENITOURINARY:   [ ]   dysuria    [ ]   hematuria  PSYCHIATRIC:   [ ]  history of major depression  INTEGUMENTARY: [ ]  rashes    [ ]  ulcers  CONSTITUTIONAL:  [ ]  fever     [ ]  chills  Physical Examination  Filed Vitals:   12/07/11 1222  BP: 147/67  Pulse: 88  Resp: 18  Height: 5\' 3"  (1.6 m)  Weight: 161 lb (73.029 kg)   Body mass index is 28.52 kg/(m^2).  General: A&O x 3, WDWN  Head: [place]/AT  Ear/Nose/Throat: Hearing grossly intact, nares w/o erythema or drainage, oropharynx w/o Erythema/Exudate  Eyes: PERRLA, EOMI  Neck: Supple, no nuchal rigidity, no palpable LAD  Pulmonary: Sym exp, good air movt, CTAB, no rales, rhonchi, & wheezing  Cardiac: RRR, Nl S1, S2, no Murmurs, rubs or gallops  Vascular: Vessel Right Left  Radial Palpable Palpable  Brachial Palpable Palpable  Carotid Palpable, without bruit Palpable, without bruit  Aorta Non-palpable N/A  Femoral Palpable Palpable  Popliteal Non-palpable Non-palpable  PT Non-palpable Non-Palpable  DP Non-Palpable Non-Palpable   Gastrointestinal: soft, NTND, -G/R, - HSM, - masses, - CVAT B  Musculoskeletal: M/S 5/5 throughout , Extremities without ischemic changes   Neurologic: CN 2-12 intact , Pain and light touch intact in extremities , Motor exam as listed above  Psychiatric: Judgment intact, Mood & affect appropriate for pt's clinical  situation  Dermatologic: See M/S exam for extremity exam, no rashes otherwise noted  Lymph : No Cervical, Axillary, or Inguinal lymphadenopathy   Non-Invasive Vascular Imaging  Vein Mapping  (Date: 12/07/11):   R arm: acceptable vein conduits include upper arm cephalic and basilic vein  L arm: acceptable vein conduits include upper arm cephalic and basilic vein  Outside Studies/Documentation 5 pages of outside documents were reviewed including: nephrology clinic chart and outpatient PCP chart.  Medical Decision Making  Sara [name] is a 78 y.o. female who presents with ESRD requiring hemodialysis.   Based on vein mapping and examination, this patient's permanent access options include: B BC AVF , B stage BVT.  I would start with a L BC AVF vs L 1st stage BVT.  I had an extensive discussion with this patient in regards to the nature of access surgery, including risk, benefits, and alternatives.    The patient is aware that the risks of access surgery include but are not limited to: bleeding, infection, steal syndrome, nerve damage, ischemic monomelic neuropathy, failure of access to mature, and possible need for additional access procedures in the future.  The patient has agreed to proceed with the above procedure which will be scheduled 26th APR 13 with one of my partners, as the patient needs a Friday date due to transportation issues.  Leonides Sake, MD [facility] Office: [phone] Pager: [phone]  12/07/2011, 1:14 PM

## 2011-12-11 ENCOUNTER — Encounter (HOSPITAL_COMMUNITY): Payer: Self-pay | Admitting: Pharmacy Technician

## 2011-12-14 ENCOUNTER — Other Ambulatory Visit: Payer: Self-pay

## 2011-12-19 ENCOUNTER — Encounter (HOSPITAL_COMMUNITY): Payer: Self-pay | Admitting: *Deleted

## 2011-12-19 NOTE — Progress Notes (Signed)
I requested Chest X-ray, 2 D echo and cardiology notes from [facility] and EKG and office notes from Dr Modesto Charon at [facility].

## 2011-12-20 MED ORDER — CEFAZOLIN SODIUM 1-5 GM-% IV SOLN
1.0000 g | Freq: Once | INTRAVENOUS | Status: AC
  Administered 2011-12-21: 1 g via INTRAVENOUS
  Filled 2011-12-20: qty 50

## 2011-12-20 NOTE — Consult Note (Signed)
Anesthesia Chart Review:  Patient is a 78 year old female scheduled for creation of a Kauffman AVF versus basilic vein transposition (first stage) on 12/21/11.  By notes, she is already requiring HD on MWF. She is scheduled to be a same day work-up.  Her last PCP visit was with Dr. Leodis Sias with [facility]  Nephrologist is Dr. Kristian Covey. Hematologist is Dr. Earma Reading.  She was recently evaluated by  Pulmonologist Dr. Cherie Ouch and Cardiologist Dr. Glorianne Manchester during her hospital admission (See records included).    History includes non-smoker, HTN, hypothyroidism, HLD, anemia, CAD, CHF, myelodysplastic syndrome, cataracts, splenomegaly with chronic thrombocytopenia, DM2, PUD, MR (none noted on 10/15/11 echo), fatty liver with cirrhosis, hyperparathyroidism, multiple surgeries including right nephrectomy.  She was hospitalized at [facility] from 10/12/11 to 10/29/11 for SOB/edema with finding of large right pleural effusion, acute renal failure with volume excess requiring HD.  She has also be evaluated by Cardiologist Dr. Peter Sw[place]and on 10/11/10 for edema.  By his notes, she has a history of an abnormal stress test in 2002 showing possible mild apical ischemia, but subsequently had a normal cardiac cath.    Echo done at [facility] on 10/15/11 showed normal LV systolic function, EF 60-65%, mild LVH, abnormal LV diastolic function, LV diastolic filling pattern consistent with elevated mean left atrial pressure, LA mildly dilated, RV systolic function normal, moderate aortic cusp sclerosis, no AR or MR, mild TR, RV systolic pressure estimated at 35-40 mmHg, mild pulmonary HTN, mild PR, trivial pericardial effusion, large right pleural effusion.  EKG on 10/06/11 showed NSR, PACs.    She will need a repeat CXR and labs on arrival.  Will add CBC since with history of thrombocytopenia and anemia requiring both PRBC and PLT transfusion.

## 2011-12-21 ENCOUNTER — Encounter (HOSPITAL_COMMUNITY): Payer: Self-pay | Admitting: Vascular Surgery

## 2011-12-21 ENCOUNTER — Ambulatory Visit (HOSPITAL_COMMUNITY)
Admission: RE | Admit: 2011-12-21 | Discharge: 2011-12-21 | Disposition: A | Discharge status: Home / Self Care | Payer: Medicare Other | Source: Ambulatory Visit | Attending: Vascular Surgery | Admitting: Vascular Surgery

## 2011-12-21 ENCOUNTER — Ambulatory Visit (HOSPITAL_COMMUNITY): Payer: Medicare Other | Admitting: Vascular Surgery

## 2011-12-21 ENCOUNTER — Telehealth: Payer: Self-pay | Admitting: Vascular Surgery

## 2011-12-21 ENCOUNTER — Encounter (HOSPITAL_COMMUNITY)
Admission: RE | Disposition: A | Discharge status: Home / Self Care | Payer: Self-pay | Source: Ambulatory Visit | Attending: Vascular Surgery

## 2011-12-21 ENCOUNTER — Ambulatory Visit (HOSPITAL_COMMUNITY): Payer: Medicare Other

## 2011-12-21 DIAGNOSIS — I2510 Atherosclerotic heart disease of native coronary artery without angina pectoris: Secondary | ICD-10-CM | POA: Insufficient documentation

## 2011-12-21 DIAGNOSIS — E119 Type 2 diabetes mellitus without complications: Secondary | ICD-10-CM | POA: Insufficient documentation

## 2011-12-21 DIAGNOSIS — E785 Hyperlipidemia, unspecified: Secondary | ICD-10-CM | POA: Insufficient documentation

## 2011-12-21 DIAGNOSIS — N186 End stage renal disease: Secondary | ICD-10-CM

## 2011-12-21 DIAGNOSIS — M109 Gout, unspecified: Secondary | ICD-10-CM | POA: Insufficient documentation

## 2011-12-21 DIAGNOSIS — I509 Heart failure, unspecified: Secondary | ICD-10-CM | POA: Insufficient documentation

## 2011-12-21 DIAGNOSIS — Z794 Long term (current) use of insulin: Secondary | ICD-10-CM | POA: Insufficient documentation

## 2011-12-21 DIAGNOSIS — D649 Anemia, unspecified: Secondary | ICD-10-CM | POA: Insufficient documentation

## 2011-12-21 DIAGNOSIS — I120 Hypertensive chronic kidney disease with stage 5 chronic kidney disease or end stage renal disease: Secondary | ICD-10-CM | POA: Insufficient documentation

## 2011-12-21 DIAGNOSIS — E039 Hypothyroidism, unspecified: Secondary | ICD-10-CM | POA: Insufficient documentation

## 2011-12-21 HISTORY — PX: AV FISTULA PLACEMENT: SHX1204

## 2011-12-21 HISTORY — DX: Encounter for other specified aftercare: Z51.89

## 2011-12-21 HISTORY — DX: Reserved for inherently not codable concepts without codable children: IMO0001

## 2011-12-21 HISTORY — DX: Heart failure, unspecified: I50.9

## 2011-12-21 HISTORY — DX: Cardiac murmur, unspecified: R01.1

## 2011-12-21 HISTORY — DX: Myelodysplastic syndrome, unspecified: D46.9

## 2011-12-21 LAB — COMPREHENSIVE METABOLIC PANEL
ALT: 14 U/L (ref 0–35)
AST: 18 U/L (ref 0–37)
CO2: 29 mEq/L (ref 19–32)
Chloride: 102 mEq/L (ref 96–112)
Creatinine, Ser: 2.44 mg/dL — ABNORMAL HIGH (ref 0.50–1.10)
GFR calc Af Amer: 20 mL/min — ABNORMAL LOW (ref >90)
GFR calc non Af Amer: 17 mL/min — ABNORMAL LOW (ref >90)
Glucose, Bld: 94 mg/dL (ref 70–99)
Total Bilirubin: 0.3 mg/dL (ref 0.3–1.2)

## 2011-12-21 LAB — GLUCOSE, CAPILLARY: Glucose-Capillary: 90 mg/dL (ref 70–99)

## 2011-12-21 LAB — PROTIME-INR
INR: 1.26 (ref 0.00–1.49)
Prothrombin Time: 16.1 seconds — ABNORMAL HIGH (ref 11.6–15.2)

## 2011-12-21 LAB — CBC
Hemoglobin: 9.6 g/dL — ABNORMAL LOW (ref 12.0–15.0)
MCH: 32.1 pg (ref 26.0–34.0)
MCHC: 32.8 g/dL (ref 30.0–36.0)
RDW: 15.2 % (ref 11.5–15.5)

## 2011-12-21 LAB — SURGICAL PCR SCREEN
MRSA, PCR: NEGATIVE
Staphylococcus aureus: NEGATIVE

## 2011-12-21 LAB — APTT: aPTT: 33 seconds (ref 24–37)

## 2011-12-21 SURGERY — ARTERIOVENOUS (AV) FISTULA CREATION
Anesthesia: General | Site: Arm Upper | Laterality: Left | Wound class: Clean

## 2011-12-21 MED ORDER — FENTANYL CITRATE 0.05 MG/ML IJ SOLN
INTRAMUSCULAR | Status: DC | PRN
  Administered 2011-12-21: 50 ug via INTRAVENOUS
  Administered 2011-12-21 (×2): 25 ug via INTRAVENOUS

## 2011-12-21 MED ORDER — HYDROMORPHONE HCL PF 1 MG/ML IJ SOLN
0.2500 mg | INTRAMUSCULAR | Status: DC | PRN
  Administered 2011-12-21 (×2): 0.5 mg via INTRAVENOUS

## 2011-12-21 MED ORDER — OXYCODONE HCL 5 MG PO TABS: 5.0000 mg | ORAL_TABLET | Freq: Four times a day (QID) | ORAL | Status: AC | PRN

## 2011-12-21 MED ORDER — ONDANSETRON HCL 4 MG/2ML IJ SOLN
INTRAMUSCULAR | Status: DC | PRN
  Administered 2011-12-21: 4 mg via INTRAVENOUS

## 2011-12-21 MED ORDER — PROPOFOL 10 MG/ML IV EMUL
INTRAVENOUS | Status: DC | PRN
  Administered 2011-12-21: 70 mg via INTRAVENOUS
  Administered 2011-12-21: 20 mg via INTRAVENOUS

## 2011-12-21 MED ORDER — SODIUM CHLORIDE 0.9 % IV SOLN
INTRAVENOUS | Status: DC | PRN
  Administered 2011-12-21: 07:00:00 via INTRAVENOUS

## 2011-12-21 MED ORDER — MUPIROCIN 2 % EX OINT
TOPICAL_OINTMENT | Freq: Two times a day (BID) | CUTANEOUS | Status: DC
  Administered 2011-12-21: 1 via NASAL
  Filled 2011-12-21 (×2): qty 22

## 2011-12-21 MED ORDER — LIDOCAINE-EPINEPHRINE (PF) 1 %-1:200000 IJ SOLN
INTRAMUSCULAR | Status: DC | PRN
  Administered 2011-12-21: 30 mL via INTRADERMAL

## 2011-12-21 MED ORDER — SODIUM CHLORIDE 0.9 % IV SOLN: INTRAVENOUS | Status: DC

## 2011-12-21 MED ORDER — SODIUM CHLORIDE 0.9 % IR SOLN
Status: DC | PRN
  Administered 2011-12-21: 08:00:00

## 2011-12-21 MED ORDER — LIDOCAINE HCL (CARDIAC) 20 MG/ML IV SOLN
INTRAVENOUS | Status: DC | PRN
  Administered 2011-12-21: 30 mg via INTRAVENOUS

## 2011-12-21 MED ORDER — 0.9 % SODIUM CHLORIDE (POUR BTL) OPTIME
TOPICAL | Status: DC | PRN
  Administered 2011-12-21: 1000 mL

## 2011-12-21 SURGICAL SUPPLY — 35 items
ADH SKN CLS APL DERMABOND .7 (GAUZE/BANDAGES/DRESSINGS) ×1
ADH SKN CLS LQ APL DERMABOND (GAUZE/BANDAGES/DRESSINGS) ×1
CANISTER SUCTION 2500CC (MISCELLANEOUS) ×2 IMPLANT
CLIP TI MEDIUM 6 (CLIP) ×2 IMPLANT
CLIP TI WIDE RED SMALL 6 (CLIP) ×3 IMPLANT
CLOTH BEACON ORANGE TIMEOUT ST (SAFETY) ×2 IMPLANT
COVER PROBE W GEL 5X96 (DRAPES) IMPLANT
COVER SURGICAL LIGHT HANDLE (MISCELLANEOUS) ×4 IMPLANT
DERMABOND ADHESIVE PROPEN (GAUZE/BANDAGES/DRESSINGS) ×1
DERMABOND ADVANCED (GAUZE/BANDAGES/DRESSINGS) ×1
DERMABOND ADVANCED .7 DNX12 (GAUZE/BANDAGES/DRESSINGS) ×1 IMPLANT
DERMABOND ADVANCED .7 DNX6 (GAUZE/BANDAGES/DRESSINGS) IMPLANT
ELECT REM PT RETURN 9FT ADLT (ELECTROSURGICAL) ×2
ELECTRODE REM PT RTRN 9FT ADLT (ELECTROSURGICAL) ×1 IMPLANT
GEL ULTRASOUND 20GR AQUASONIC (MISCELLANEOUS) IMPLANT
GLOVE BIOGEL PI IND STRL 6.5 (GLOVE) IMPLANT
GLOVE BIOGEL PI IND STRL 7.0 (GLOVE) IMPLANT
GLOVE BIOGEL PI INDICATOR 6.5 (GLOVE) ×1
GLOVE BIOGEL PI INDICATOR 7.0 (GLOVE) ×1
GLOVE SS BIOGEL STRL SZ 7 (GLOVE) ×1 IMPLANT
GLOVE SUPERSENSE BIOGEL SZ 7 (GLOVE) ×1
GOWN STRL NON-REIN LRG LVL3 (GOWN DISPOSABLE) ×4 IMPLANT
KIT BASIN OR (CUSTOM PROCEDURE TRAY) ×2 IMPLANT
KIT ROOM TURNOVER OR (KITS) ×2 IMPLANT
NS IRRIG 1000ML POUR BTL (IV SOLUTION) ×2 IMPLANT
PACK CV ACCESS (CUSTOM PROCEDURE TRAY) ×2 IMPLANT
PAD ARMBOARD 7.5X6 YLW CONV (MISCELLANEOUS) ×4 IMPLANT
SPONGE GAUZE 4X4 12PLY (GAUZE/BANDAGES/DRESSINGS) ×2 IMPLANT
SUT PROLENE 6 0 BV (SUTURE) ×2 IMPLANT
SUT VIC AB 3-0 SH 27 (SUTURE) ×2
SUT VIC AB 3-0 SH 27X BRD (SUTURE) ×1 IMPLANT
TOWEL OR 17X24 6PK STRL BLUE (TOWEL DISPOSABLE) ×2 IMPLANT
TOWEL OR 17X26 10 PK STRL BLUE (TOWEL DISPOSABLE) ×2 IMPLANT
UNDERPAD 30X30 INCONTINENT (UNDERPADS AND DIAPERS) ×2 IMPLANT
WATER STERILE IRR 1000ML POUR (IV SOLUTION) ×2 IMPLANT

## 2011-12-21 NOTE — Telephone Encounter (Signed)
Patient notified of appointment via telephone call. Mailed letter to home address also regarding scheduled appointment.   

## 2011-12-21 NOTE — Transfer of Care (Signed)
Immediate Anesthesia Transfer of Care Note  Patient: Sara [name]  Procedure(s) Performed: Procedure(s) (LRB): ARTERIOVENOUS (AV) FISTULA CREATION (Left)  Patient Location: PACU  Anesthesia Type: General  Level of Consciousness: sedated  Airway & Oxygen Therapy: Patient Spontanous Breathing and Patient connected to nasal cannula oxygen  Post-op Assessment: Report given to PACU RN and Post -op Vital signs reviewed and stable  Post vital signs: Reviewed  Complications: No apparent anesthesia complications

## 2011-12-21 NOTE — Progress Notes (Signed)
Report to Stephanie RN as caregiver 

## 2011-12-21 NOTE — Anesthesia Preprocedure Evaluation (Signed)
Anesthesia Evaluation  Patient identified by MRN, date of birth, ID band Patient awake    Reviewed: Allergy & Precautions, H&P , NPO status , Patient's Chart, lab work & pertinent test results, reviewed documented beta blocker date and time   Airway Mallampati: II      Dental  (+) Teeth Intact and Dental Advisory Given   Pulmonary          Cardiovascular hypertension, Pt. on medications and Pt. on home beta blockers + CAD and +CHF + Valvular Problems/Murmurs     Neuro/Psych    GI/Hepatic PUD,   Endo/Other  Diabetes mellitus-, Well Controlled, Type 1, Insulin DependentHypothyroidism   Renal/GU      Musculoskeletal   Abdominal   Peds  Hematology   Anesthesia Other Findings   Reproductive/Obstetrics                           Anesthesia Physical Anesthesia Plan  ASA: III  Anesthesia Plan: General   Post-op Pain Management:    Induction: Intravenous  Airway Management Planned: LMA  Additional Equipment:   Intra-op Plan:   Post-operative Plan: Extubation in OR  Informed Consent: I have reviewed the patients History and Physical, chart, labs and discussed the procedure including the risks, benefits and alternatives for the proposed anesthesia with the patient or authorized representative who has indicated his/her understanding and acceptance.   Dental advisory given  Plan Discussed with: Anesthesiologist  Anesthesia Plan Comments:         Anesthesia Quick Evaluation

## 2011-12-21 NOTE — Op Note (Signed)
OPERATIVE REPORT  Date of Surgery: 12/21/2011  Surgeon: Josephina Gip, MD  Assistant: Lianne Cure PA  Pre-op Diagnosis: End Stage Renal Disease  Post-op Diagnosis: end stage renal Disease  Procedure: Procedure(s): ARTERIOVENOUS (AV) FISTULA CREATION-left brachial artery the cephalic vein  Anesthesia: General  EBL: Minimal  Complications: None  Procedure Details: Patient was taken to the operating room placed in the supine position at which time satisfactory general endotracheal anesthesia was administered. The left upper extremity was prepped with Betadine scrub and solution draped in routine sterile manner. Prior to prepping and draping the veins were imaged using the SonoSite ultrasound. The cephalic vein in the upper arm appeared adequate for AV fistula creation as did the basilic vein. Transverse incision was made in the antecubital area antecubital vein dissected free cephalic vein was mobilized its branches ligated with 4-0 silk ties and divided it was transected distally gently dilated with heparinized saline. It was a 2-1/2-3 mm vessel. It was slightly deep in the upper arm however. Brachial artery was exposed beneath the fascia encircled with Vesseloops. It was a 2-1/2 mm artery but had an excellent pulse. It was occluded proximally and distally with vessel loops opened with 15 blade extended with the Potts scissors. There was excellent inflow. The vein was carefully measured spatulated and anastomosed end-to-side with 6-0 Prolene following completion of this the Vesseloops were released and there was a good pulse and palpable thrill in the fistula with excellent Doppler flow to the upper arm and the cephalic vein. There were slight diminution of flow in the radial and ulnar arteries distally with the fistula patent. Adequate hemostasis was achieved and the wound closed in layers with Vicryl in a subcuticular fashion and Dermabond patient ID recovering in stable condition  Josephina Gip, MD 12/21/2011 9:05 AM

## 2011-12-21 NOTE — Interval H&P Note (Signed)
History and Physical Interval Note:  12/21/2011 7:27 AM  Sara [name]  has presented today for surgery, with the diagnosis of End Stage Renal Disease  The various methods of treatment have been discussed with the patient and family. After consideration of risks, benefits and other options for treatment, the patient has consented to  Procedure(s) (LRB): ARTERIOVENOUS (AV) FISTULA CREATION (Left) as a surgical intervention .  The patients' history has been reviewed, patient examined, no change in status, stable for surgery.  I have reviewed the patients' chart and labs.  Questions were answered to the patient's satisfaction.     Josephina Gip

## 2011-12-21 NOTE — Anesthesia Procedure Notes (Signed)
Procedure Name: LMA Insertion Date/Time: 12/21/2011 7:42 AM Performed by: Mancel Parsons Pre-anesthesia Checklist: Patient identified, Timeout performed, Emergency Drugs available, Suction available and Patient being monitored Patient Re-evaluated:Patient Re-evaluated prior to inductionOxygen Delivery Method: Circle system utilized Preoxygenation: Pre-oxygenation with 100% oxygen Intubation Type: IV induction LMA: LMA with gastric port inserted LMA Size: 4.0 Number of attempts: 1 Placement Confirmation: breath sounds checked- equal and bilateral and positive ETCO2 Tube secured with: Tape Dental Injury: Injury to lip

## 2011-12-21 NOTE — Preoperative (Signed)
Beta Blockers   Reason not to administer Beta Blockers:Not Applicable. Coreg @0400  12/21/11

## 2011-12-21 NOTE — Procedures (Unsigned)
CEPHALIC VEIN MAPPING  INDICATION:  End-stage renal disease  HISTORY:  EXAM:  The right cephalic vein is compressible.  Diameter measurements range from 0.24 to 0.38 cm.  The right basilic vein is compressible with diameter measurements ranging from 0.28 to 0.42 cm.  The left cephalic vein is compressible  Diameter measurements range from 0.19 to 0.40 cm.  The left basilic vein is compressible with diameter measurements ranging from 0.24 to 0.46 cm.  See attached worksheet for all measurements.  IMPRESSION:  Patent bilateral cephalic and basilic veins with diameter measurements as described above.  ___________________________________________ Fransisco Hertz, MD  CH/MEDQ  D:  12/10/2011  T:  12/10/2011  Job:  [id number]

## 2011-12-21 NOTE — OR Nursing (Signed)
Preoperative assessment completed by Avanell Shackleton RN, Documented by J. WelchRN through verbal report.

## 2011-12-21 NOTE — Anesthesia Postprocedure Evaluation (Signed)
  Anesthesia Post-op Note  Patient: Sara [name]  Procedure(s) Performed: Procedure(s) (LRB): ARTERIOVENOUS (AV) FISTULA CREATION (Left)  Patient Location: PACU  Anesthesia Type: General  Level of Consciousness: awake  Airway and Oxygen Therapy: Patient Spontanous Breathing and Patient connected to nasal cannula oxygen  Post-op Pain: mild  Post-op Assessment: Post-op Vital signs reviewed, Patient's Cardiovascular Status Stable, Respiratory Function Stable, Patent Airway and No signs of Nausea or vomiting  Post-op Vital Signs: Reviewed and stable  Complications: No apparent anesthesia complications

## 2011-12-21 NOTE — H&P (View-Only) (Signed)
[facility] [place]  Referred by:  Belayenh S Befekadu, MD [address] [place], [place] [address]  Reason for referral: New access  History of Present Illness  Sara [name] is a 78 y.o. (09/24/1924) female who presents for evaluation for permanent access.  The patient is right hand dominant.  The patient has not had previous access procedures except recent TDC.  Previous central venous cannulation procedures include: RIJ TDC.  The patient has never had a PPM placed.   Past Medical History  Diagnosis Date  . Hypertension 1991  . Hypothyroidism 1985  . Hyperlipidemia 1980  . Chronic anemia   . Gouty arthritis   . Cataracts, bilateral   . CAD (coronary artery disease) 06/2001  . Chronic renal failure   . Diverticula of colon     (L)  . Hemorrhoids   . Hematuria 03/2002  . Splenomegaly 2004  . Diabetes mellitus 1995  . Mitral valve regurgitation   . Restless leg syndrome 12/05  . PUD (peptic ulcer disease)   . Osteopenia 2007    leftt hip   . Hyperparathyroidism   . Gastric polyps   . Cirrhosis   . Obesity   . Fatty liver   . GI bleed     Past Surgical History  Procedure Date  . Total abdominal hysterectomy 1966    fibroids   . Umbilical hernia repair 1960  . L-s bal surgery hemorrhoids 1984 & 1986  . Cholecystectomy 1970  . Appendectomy 1958  . Tonsillectomy   . Partial nephrectomy     right  . Upper gastrointestinal endoscopy 02/11/2009  . Upper gastrointestinal endoscopy 07/02/08    History   Social History  . Marital Status: Married    Spouse Name: N/A    Number of Children: N/A  . Years of Education: N/A   Occupational History  . Not on file.   Social History Main Topics  . Smoking status: Never Smoker   . Smokeless tobacco: Never Used  . Alcohol Use: No  . Drug Use: No  . Sexually Active: Not on file   Other Topics Concern  . Not on file   Social History Narrative  . No narrative on file    Family  History  Problem Relation Age of Onset  . Heart disease Father   . Diabetes Father   . Stroke Father   . Diabetes Sister   . Healthy Sister   . Healthy Son   . Healthy Son   . Healthy Daughter     Current Outpatient Prescriptions on File Prior to Visit  Medication Sig Dispense Refill  . allopurinol (ZYLOPRIM) 100 MG tablet Take 300 mg by mouth. Take 1/2 tablet daily      . amLODipine (NORVASC) 10 MG tablet Take 10 mg by mouth daily.        . carvedilol (COREG) 25 MG tablet TAKE ONE TABLET BY MOUTH TWICE DAILY  60 tablet  0  . Cholecalciferol (VITAMIN D) 2000 UNITS CAPS Take by mouth every evening.        . cloNIDine (CATAPRES) 0.3 MG tablet Take 0.3 mg by mouth 2 (two) times daily.        . folic acid (FOLVITE) 1 MG tablet Take 1 mg by mouth every morning.        . glimepiride (AMARYL) 4 MG tablet Take 4 mg by mouth daily before breakfast.        . insulin glargine (LANTUS) 100 UNIT/ML   injection Inject 20 Units into the skin at bedtime.       . levothyroxine (SYNTHROID, LEVOTHROID) 200 MCG tablet Take 200 mcg by mouth daily.        . Multiple Vitamins-Minerals (CENTRUM SILVER PO) Take by mouth every morning.        . Multiple Vitamins-Minerals (PRESERVISION AREDS PO) Take by mouth 3 (three) times daily - between meals and at bedtime.        . Omega-3 Fatty Acids (FISH OIL) 1000 MG CAPS Take by mouth every morning.        . omeprazole (PRILOSEC OTC) 20 MG tablet Take 20 mg by mouth daily.        . simvastatin (ZOCOR) 20 MG tablet Take 10 mg by mouth. Take 1/2 tablet at bedtime      . torsemide (DEMADEX) 20 MG tablet Take 20 mg by mouth 3 (three) times daily.      . vitamin B-12 (CYANOCOBALAMIN) 1000 MCG tablet Take 1,000 mcg by mouth every morning.        . furosemide (LASIX) 40 MG tablet Take 40 mg by mouth daily.       . NOVOLOG 100 UNIT/ML injection         Allergies  Allergen Reactions  . Aspirin Adult Low (Aspirin)   . Nsaids   . Sulfa Antibiotics     REVIEW OF SYSTEMS:   (Positives indicated with an "x", otherwise negative)  CARDIOVASCULAR: [ ] chest pain    [ ] chest pressure    [ ] palpitations    [ ] orthopnea   [ ] dyspnea on exert. [ ] claudication    [ ] rest pain     [ ] DVT     [ ] phlebitis [x] CHF  PULMONARY:    [ ] productive cough [ ] asthma  [ ] wheezing  NEUROLOGIC:    [ ] weakness    [ ] paresthesias   [ ] aphasia    [ ] amaurosis    [ ] dizziness  HEMATOLOGIC:    [ ] bleeding problems  [ ] clotting disorders [x] anemia  MUSCULOSKEL: [ ] joint pain     [ ] joint swelling  GASTROINTEST:  [ ]  blood in stool   [ ]  hematemesis  GENITOURINARY:   [ ]  dysuria    [ ]  hematuria  PSYCHIATRIC:   [ ] history of major depression  INTEGUMENTARY: [ ] rashes    [ ] ulcers  CONSTITUTIONAL:  [ ] fever     [ ] chills  Physical Examination  Filed Vitals:   12/07/11 1222  BP: 147/67  Pulse: 88  Resp: 18  Height: 5' 3" (1.6 m)  Weight: 161 lb (73.029 kg)   Body mass index is 28.52 kg/(m^2).  General: A&O x 3, WDWN  Head: [place]/AT  Ear/Nose/Throat: Hearing grossly intact, nares w/o erythema or drainage, oropharynx w/o Erythema/Exudate  Eyes: PERRLA, EOMI  Neck: Supple, no nuchal rigidity, no palpable LAD  Pulmonary: Sym exp, good air movt, CTAB, no rales, rhonchi, & wheezing  Cardiac: RRR, Nl S1, S2, no Murmurs, rubs or gallops  Vascular: Vessel Right Left  Radial Palpable Palpable  Brachial Palpable Palpable  Carotid Palpable, without bruit Palpable, without bruit  Aorta Non-palpable N/A  Femoral Palpable Palpable  Popliteal Non-palpable Non-palpable  PT Non-palpable Non-Palpable  DP Non-Palpable Non-Palpable   Gastrointestinal: soft, NTND, -G/R, - HSM, - masses, - CVAT B    Musculoskeletal: M/S 5/5 throughout , Extremities without ischemic changes   Neurologic: CN 2-12 intact , Pain and light touch intact in extremities , Motor exam as listed above  Psychiatric: Judgment intact, Mood & affect appropriate for pt's clinical  situation  Dermatologic: See M/S exam for extremity exam, no rashes otherwise noted  Lymph : No Cervical, Axillary, or Inguinal lymphadenopathy   Non-Invasive Vascular Imaging  Vein Mapping  (Date: 12/07/11):   R arm: acceptable vein conduits include upper arm cephalic and basilic vein  L arm: acceptable vein conduits include upper arm cephalic and basilic vein  Outside Studies/Documentation 5 pages of outside documents were reviewed including: nephrology clinic chart and outpatient PCP chart.  Medical Decision Making  Sara [name] is a 78 y.o. female who presents with ESRD requiring hemodialysis.   Based on vein mapping and examination, this patient's permanent access options include: B BC AVF , B stage BVT.  I would start with a L BC AVF vs L 1st stage BVT.  I had an extensive discussion with this patient in regards to the nature of access surgery, including risk, benefits, and alternatives.    The patient is aware that the risks of access surgery include but are not limited to: bleeding, infection, steal syndrome, nerve damage, ischemic monomelic neuropathy, failure of access to mature, and possible need for additional access procedures in the future.  The patient has agreed to proceed with the above procedure which will be scheduled 26th APR 13 with one of my partners, as the patient needs a Friday date due to transportation issues.  Sara Riviere, MD [facility] [place] Office: [phone] Pager: [phone]  12/07/2011, 1:14 PM     

## 2011-12-21 NOTE — Telephone Encounter (Signed)
Message copied by Fredrich Birks on Fri Dec 21, 2011  3:04 PM ------      Message from: Melene Plan      Created: Fri Dec 21, 2011  2:15 PM                   ----- Message -----         From: Lars Mage, PA         Sent: 12/21/2011   8:51 AM           To: Melene Plan, RN            BC fistula left upper arm f/u in 6 weeks with dr. Hart Rochester      Thanks

## 2011-12-21 NOTE — Telephone Encounter (Signed)
Message copied by Fredrich Birks on Fri Dec 21, 2011  3:00 PM ------      Message from: Melene Plan      Created: Fri Dec 21, 2011  2:15 PM                   ----- Message -----         From: Lars Mage, PA         Sent: 12/21/2011   8:51 AM           To: Melene Plan, RN            BC fistula left upper arm f/u in 6 weeks with dr. Hart Rochester      Thanks

## 2011-12-21 NOTE — Discharge Instructions (Signed)
Arteriovenous (AV) Access for Hemodialysis An arteriovenous (AV) access is a surgically created or placed tube that allows for repeated access to the blood in your body. This access is required for hemodialysis, a type of dialysis. Dialysis is a treatment process that filters and cleans the blood in order to eliminate toxic wastes from the body when the kidneys fail to do this on their own. There are several different access methods used for hemodialysis. ACCESS METHODS  Double lumen catheter. A flexible tube with 2 channels may be used on a temporary or long-term basis. A long-term use catheter often has a cuff that holds it in place. The catheter is surgically placed and tunneled under the skin. This catheter is often placed when dialysis is needed in an emergency, such as when the kidneys suddenly stop working. It may also be needed when a permanent AV access fails or has not yet been placed. A catheter is usually placed into one of the following:   Large vein under your collarbone (subclavian vein).   Large vein in your neck (jugular vein).   Temporarily, in the large vein in your groin (femoral vein).   AV graft. A man-made (synthetic) material may be used to connect an artery and vein in the arm or thigh. This graft takes around 2 weeks to develop (mature) and is often placed a few weeks prior to use. A graft can generally last from 1 to 2 years.   AV fistula. A minor surgical procedure may be done to connect an artery and vein, creating a fistula. This causes arterial blood to flow directly into a vein. The vein gets larger, allowing easier access for dialysis. The fistula takes around 12 weeks to mature and must be placed several months before dialysis is anticipated. A fistula provides the best access for hemodialysis and can last for several years.  It is critical that veins in patients at high risk to develop kidney failure are preserved. This may include avoiding blood pressure checks and  intravenous (IV) or lab draws from the arm. This maximizes the chance for creating a functioning AV access when needed. Although a fistula is the most desirable access to use for hemodialysis, it may not be possible. If the veins are not large enough or there is no time to wait for a fistula to mature, a graft or catheter may be used. RISKS AND COMPLICATIONS   Double lumen catheter. A catheter may develop serious infections. It may also develop a clot (thrombosis) and fail. A catheter can cause clots in the vein in which it is placed. A subclavian vein catheter is the most likely to cause thrombosis. When the subclavian vein clots, it makes it very difficult for the patient to sustain an AV graft or fistula. When possible, catheters should be avoided and a more permanent AV access should be placed.   AV graft. A graft may swell after surgery, but this should decrease as it heals. A graft may stop working properly due to thrombosis or the diameter of the tube getting smaller. The tube can eventually become blocked (stenosis). If a partial blockage is found relatively early, it can be treated. Left untreated, the stenosis will progress until the vessel is completely blocked. Infection may also occur.   AV fistula. Infection and thrombosis are the biggest risks with a fistula. However, the rates for infection and stenosis are lower with fistulas than the other 2 methods. Frequent thrombosis may require creating a backup fistula at another site.   This will allow for dialysis when one access is blocked.  HOME CARE INSTRUCTIONS   Keep the site of the cut (incision) clean and dry while it heals. This helps prevent infection.   If you have a catheter, do not shower while the incision is healing. After it is healed, ask your caregiver for recommendations on showering. The bandage (dressing) will be changed at the dialysis center. Do not remove this dressing. However, if it becomes wet or loose, a sterile gauze  dressing may be placed over the site and secured by placing adhesive tape on the edges.   If you have a graft or fistula, clean the site daily until it heals completely. Stitches will be removed, usually after about 10 to 14 days. Once the access is being used for dialysis, a small dressing will be placed over the needle sites after the treatment is done. Keep this dressing on for at least 12 hours. Keep your arm or thigh clean and dry during this time.   A small amount of bleeding is normal, especially if the access is new. If you have a large amount of bleeding and cannot stop it, this is not normal. Call your caregiver right away. You will be taught how to hold your graft or fistula sites to stop the bleeding.  If you have a graft or fistula:  A "bruit" is a noise that is heard with a stethoscope and a "thrill" is a vibration felt over the graft or fistula. The presence of the bruit and thrill indicate that the access is working. You will be taught to feel for the thrill each day. If this is not felt, the access may be clotted. Call your caregiver.   You may use the arm freely after the site heals. Keep the following in mind:   Avoid pressure on the arm.   Avoid lifting heavy objects with the arm.   Avoid sleeping on the arm with the graft or fistula.   Avoid wearing tight-sleeved shirts or jewelry around the graft or fistula.   Do not allow blood pressure monitoring or needle punctures on the side where the graft or fistula is located.   With permission from your caregiver, you may do exercises to help with blood flow through a fistula. These exercises involve squeezing a rubber ball or other soft objects as instructed.  SEEK MEDICAL CARE IF:   Chills develop.   You have an oral temperature above 102 F (38.9 C).   Swelling around the graft or fistula gets worse.   New pain develops.   Unusual bleeding develops.   Pus or other fluid (drainage) is seen at the AV access site.    Skin redness or red streaking is seen on the skin around, above, or below the AV access.  SEEK IMMEDIATE MEDICAL CARE IF:   Pain, numbness, or an unusual pale skin color develops in the hand on the side of your fistula.   Dizziness or weakness develops that you have not had before.   Shortness of breath develops.   Chest pain develops.   The AV access has bleeding that cannot be easily controlled.  Wear a medical alert bracelet to let caregivers know you are a dialysis patient, so they can care for your veins appropriately. Document Released: 11/03/2002 Document Revised: 08/02/2011 Document Reviewed: 01/10/2010 [employer] Patient Information 2012 [employer]. 

## 2011-12-24 ENCOUNTER — Encounter (HOSPITAL_COMMUNITY): Payer: Self-pay | Admitting: Vascular Surgery

## 2012-01-22 ENCOUNTER — Ambulatory Visit (INDEPENDENT_AMBULATORY_CARE_PROVIDER_SITE_OTHER): Payer: Medicare Other | Admitting: Internal Medicine

## 2012-02-05 ENCOUNTER — Ambulatory Visit: Payer: Medicare Other | Admitting: Vascular Surgery

## 2012-02-05 ENCOUNTER — Encounter: Payer: Medicare Other | Admitting: Internal Medicine

## 2012-02-06 ENCOUNTER — Encounter: Payer: Medicare Other | Admitting: Internal Medicine

## 2012-02-11 ENCOUNTER — Encounter: Payer: Self-pay | Admitting: Vascular Surgery

## 2012-02-12 ENCOUNTER — Encounter: Payer: Self-pay | Admitting: Vascular Surgery

## 2012-02-12 ENCOUNTER — Ambulatory Visit (INDEPENDENT_AMBULATORY_CARE_PROVIDER_SITE_OTHER): Payer: Medicare Other | Admitting: Vascular Surgery

## 2012-02-12 VITALS — BP 125/73 | HR 75 | Resp 18 | Ht 63.0 in | Wt 163.5 lb

## 2012-02-12 DIAGNOSIS — N186 End stage renal disease: Secondary | ICD-10-CM

## 2012-02-12 NOTE — Progress Notes (Signed)
Subjective:     Patient ID: Sara [name], female   DOB: 07/13/1925, 78 y.o.   MRN: [id number]  HPI this 78 year old female with end-stage renal disease on hemodialysis since March 2013 returns for initial followup regarding a left brachial-cephalic AV fistula I created in the mid April of this year. She denies any pain or numbness in the left hand. Occasionally when she turns over at night she will notice the left hand becoming numb temporarily but never during the day time. She is currently being dialyzed through a hemodialysis catheter in the right internal jugular vein  Review of Systems     Objective:   Physical ExamBP 125/73  Pulse 75  Resp 18  Ht 5\' 3"  (1.6 m)  Wt 163 lb 8 oz (74.163 kg)  BMI 28.96 kg/m2  Left upper extremity with well-healed antecubital 1. There is an excellent pulse and palpable thrill in the upper arm fistula. 2+ radial pulse palpable distally with no evidence of ischemia or steal syndrome.    Assessment:     Well functioning left brachial to cephalic AV fistula currently on hemodialysis    Plan:     Plan to utilize fistula in mid to late July 2013 Return to see [place] on when necessary basis

## 2012-05-06 ENCOUNTER — Other Ambulatory Visit: Payer: Self-pay

## 2012-05-06 DIAGNOSIS — T82598A Other mechanical complication of other cardiac and vascular devices and implants, initial encounter: Secondary | ICD-10-CM

## 2012-05-09 ENCOUNTER — Ambulatory Visit: Payer: Medicare Other | Admitting: Vascular Surgery

## 2012-05-15 ENCOUNTER — Encounter: Payer: Self-pay | Admitting: Vascular Surgery

## 2012-05-16 ENCOUNTER — Encounter: Payer: Self-pay | Admitting: Vascular Surgery

## 2012-05-16 ENCOUNTER — Other Ambulatory Visit: Payer: Self-pay

## 2012-05-16 ENCOUNTER — Ambulatory Visit (INDEPENDENT_AMBULATORY_CARE_PROVIDER_SITE_OTHER): Payer: Medicare Other | Admitting: Vascular Surgery

## 2012-05-16 ENCOUNTER — Encounter (INDEPENDENT_AMBULATORY_CARE_PROVIDER_SITE_OTHER): Payer: Medicare Other | Admitting: *Deleted

## 2012-05-16 VITALS — BP 115/70 | HR 57 | Resp 16 | Ht 63.0 in | Wt 171.0 lb

## 2012-05-16 DIAGNOSIS — T82598A Other mechanical complication of other cardiac and vascular devices and implants, initial encounter: Secondary | ICD-10-CM

## 2012-05-16 DIAGNOSIS — Z48812 Encounter for surgical aftercare following surgery on the circulatory system: Secondary | ICD-10-CM

## 2012-05-16 DIAGNOSIS — N186 End stage renal disease: Secondary | ICD-10-CM

## 2012-05-16 NOTE — Progress Notes (Signed)
[facility] [place]  Established Dialysis Access  History of Present Illness  Kamela T Mayr is a 78 y.o. (08/11/1925) female who presents for re-evaluation of L BC AVF.  Pt notes difficulties with cannulation during HD.  She had her L BC AVF placed in 12/21/11 by Dr. Lawson.  She denies any steal sx.  She is able to complete ADLs.  Past Medical History, Past Surgical History, Social History, Family History, Medications, Allergies, and Review of Systems are unchanged from previous visit on 02/12/12.  Physical Examination  Filed Vitals:   05/16/12 1122  BP: 115/70  Pulse: 57  Resp: 16  Height: 5' 3" (1.6 m)  Weight: 171 lb (77.565 kg)  SpO2: 100%   Body mass index is 30.29 kg/(m^2).  General: A&O x 3, WD, elderly  Pulmonary: Sym exp, good air movt, CTAB, no rales, rhonchi, & wheezing  Cardiac: RRR, Nl S1, S2, no Murmurs, rubs or gallops  Gastrointestinal: soft, NTND, -G/R, - HSM, - masses, - CVAT B  Musculoskeletal: M/S 5/5 throughout , Extremities without  ischemic changes , palpable thrill which attenuates proximally, +bruit  Neurologic: Pain and light touch intact in extremities , Motor exam as listed above  Non-Invasive Vascular Imaging  LUE Access Duplex (Date: 05/16/12):   Possible valvular stenosis at 731 c/s in perianastomotic segment  Rest of fistula 112-143 c/s  Medical Decision Making  Manda T Bullard is a 78 y.o. female who presents with ESRD requiring hemodialysis, possible valvular stenosis in L BC AVF  I recommended to the patient proceeding with: L arm fistulogram with possible intervention.  This will be scheduled 30 SEP 13. I discussed with the patient the nature of angiographic procedures, especially the limited patencies of any endovascular intervention.  The patient is aware of that the risks of an angiographic procedure include but are not limited to: bleeding, infection, access site complications, renal failure,  embolization, rupture of vessel, dissection, possible need for emergent surgical intervention, possible need for surgical procedures to treat the patient's pathology, and stroke and death.   The patient is aware of the risks and agrees to proceed.  Dominique Ressel, MD [facility] [place] Office: [phone] Pager: [phone]  05/16/2012, 4:21 PM   

## 2012-05-20 ENCOUNTER — Encounter (HOSPITAL_COMMUNITY): Payer: Self-pay | Admitting: Pharmacy Technician

## 2012-05-30 ENCOUNTER — Ambulatory Visit (HOSPITAL_COMMUNITY)
Admission: RE | Admit: 2012-05-30 | Discharge: 2012-05-30 | Disposition: A | Discharge status: Home / Self Care | Payer: Medicare Other | Source: Ambulatory Visit | Attending: Vascular Surgery | Admitting: Vascular Surgery

## 2012-05-30 ENCOUNTER — Encounter (HOSPITAL_COMMUNITY)
Admission: RE | Disposition: A | Discharge status: Home / Self Care | Payer: Self-pay | Source: Ambulatory Visit | Attending: Vascular Surgery

## 2012-05-30 DIAGNOSIS — Y849 Medical procedure, unspecified as the cause of abnormal reaction of the patient, or of later complication, without mention of misadventure at the time of the procedure: Secondary | ICD-10-CM | POA: Insufficient documentation

## 2012-05-30 DIAGNOSIS — T82598A Other mechanical complication of other cardiac and vascular devices and implants, initial encounter: Secondary | ICD-10-CM | POA: Insufficient documentation

## 2012-05-30 DIAGNOSIS — T82898A Other specified complication of vascular prosthetic devices, implants and grafts, initial encounter: Secondary | ICD-10-CM

## 2012-05-30 HISTORY — PX: FISTULOGRAM: SHX5832

## 2012-05-30 LAB — POCT I-STAT, CHEM 8
Chloride: 96 mEq/L (ref 96–112)
Creatinine, Ser: 3.1 mg/dL — ABNORMAL HIGH (ref 0.50–1.10)
Glucose, Bld: 114 mg/dL — ABNORMAL HIGH (ref 70–99)
HCT: 32 % — ABNORMAL LOW (ref 36.0–46.0)
Potassium: 4.9 mEq/L (ref 3.5–5.1)

## 2012-05-30 SURGERY — FISTULOGRAM
Anesthesia: LOCAL | Laterality: Left

## 2012-05-30 MED ORDER — ALUM & MAG HYDROXIDE-SIMETH 200-200-20 MG/5ML PO SUSP: 15.0000 mL | ORAL | Status: DC | PRN

## 2012-05-30 MED ORDER — PANTOPRAZOLE SODIUM 40 MG PO TBEC: 40.0000 mg | DELAYED_RELEASE_TABLET | Freq: Every day | ORAL | Status: DC

## 2012-05-30 MED ORDER — ACETAMINOPHEN 325 MG PO TABS: 325.0000 mg | ORAL_TABLET | ORAL | Status: DC | PRN

## 2012-05-30 MED ORDER — LABETALOL HCL 5 MG/ML IV SOLN: 10.0000 mg | INTRAVENOUS | Status: DC | PRN

## 2012-05-30 MED ORDER — HYDRALAZINE HCL 20 MG/ML IJ SOLN: 10.0000 mg | INTRAMUSCULAR | Status: DC | PRN

## 2012-05-30 MED ORDER — ONDANSETRON HCL 4 MG/2ML IJ SOLN: 4.0000 mg | Freq: Four times a day (QID) | INTRAMUSCULAR | Status: DC | PRN

## 2012-05-30 MED ORDER — GUAIFENESIN-DM 100-10 MG/5ML PO SYRP: 15.0000 mL | ORAL_SOLUTION | ORAL | Status: DC | PRN

## 2012-05-30 MED ORDER — SODIUM CHLORIDE 0.9 % IJ SOLN: 3.0000 mL | INTRAMUSCULAR | Status: DC | PRN

## 2012-05-30 MED ORDER — ACETAMINOPHEN 325 MG RE SUPP: 325.0000 mg | RECTAL | Status: DC | PRN

## 2012-05-30 MED ORDER — METOPROLOL TARTRATE 1 MG/ML IV SOLN: 2.0000 mg | INTRAVENOUS | Status: DC | PRN

## 2012-05-30 MED ORDER — PHENOL 1.4 % MT LIQD: 1.0000 | OROMUCOSAL | Status: DC | PRN

## 2012-05-30 NOTE — H&P (View-Only) (Signed)
[facility] [place]  Established Dialysis Access  History of Present Illness  Sara [name] is a 78 y.o. (11/03/1924) female who presents for re-evaluation of L BC AVF.  Pt notes difficulties with cannulation during HD.  She had her L BC AVF placed in 12/21/11 by Dr. Lawson.  She denies any steal sx.  She is able to complete ADLs.  Past Medical History, Past Surgical History, Social History, Family History, Medications, Allergies, and Review of Systems are unchanged from previous visit on 02/12/12.  Physical Examination  Filed Vitals:   05/16/12 1122  BP: 115/70  Pulse: 57  Resp: 16  Height: 5' 3" (1.6 m)  Weight: 171 lb (77.565 kg)  SpO2: 100%   Body mass index is 30.29 kg/(m^2).  General: A&O x 3, WD, elderly  Pulmonary: Sym exp, good air movt, CTAB, no rales, rhonchi, & wheezing  Cardiac: RRR, Nl S1, S2, no Murmurs, rubs or gallops  Gastrointestinal: soft, NTND, -G/R, - HSM, - masses, - CVAT B  Musculoskeletal: M/S 5/5 throughout , Extremities without  ischemic changes , palpable thrill which attenuates proximally, +bruit  Neurologic: Pain and light touch intact in extremities , Motor exam as listed above  Non-Invasive Vascular Imaging  LUE Access Duplex (Date: 05/16/12):   Possible valvular stenosis at 731 c/s in perianastomotic segment  Rest of fistula 112-143 c/s  Medical Decision Making  Sara [name] is a 78 y.o. female who presents with ESRD requiring hemodialysis, possible valvular stenosis in L BC AVF  I recommended to the patient proceeding with: L arm fistulogram with possible intervention.  This will be scheduled 30 SEP 13. I discussed with the patient the nature of angiographic procedures, especially the limited patencies of any endovascular intervention.  The patient is aware of that the risks of an angiographic procedure include but are not limited to: bleeding, infection, access site complications, renal failure,  embolization, rupture of vessel, dissection, possible need for emergent surgical intervention, possible need for surgical procedures to treat the patient's pathology, and stroke and death.   The patient is aware of the risks and agrees to proceed.  Ahmad Vanwey, MD [facility]  Office: [phone] Pager: [phone]  05/16/2012, 4:21 PM   

## 2012-05-30 NOTE — Interval H&P Note (Signed)
History and Physical Interval Note:  05/30/2012 7:34 AM  Sara [name]  has presented today for surgery, with the diagnosis of pvd  The various methods of treatment have been discussed with the patient and family. After consideration of risks, benefits and other options for treatment, the patient has consented to  Procedure(s) (LRB) with comments: FISTULOGRAM (Left) as a surgical intervention .  The patient's history has been reviewed, patient examined, no change in status, stable for surgery.  I have reviewed the patient's chart and labs.  Questions were answered to the patient's satisfaction.     FIELDS,CHARLES E  + thrill left upper arm AVF Fistulogram today due to non maturation  Fabienne Bruns, MD [facility] [place] Office: [phone] Pager: [phone]

## 2012-05-30 NOTE — Op Note (Signed)
Procedure: Left brachial cephalic fistulogram  Preoperative diagnosis: Non maturing AV fistula  Postoperative diagnosis: Same  Anesthesia: Local  Operative details: After obtaining informed consent, the patient was taken to the PV lab. The patient was placed in supine position on the Angio table. Entire left upper extremity was prepped and draped in usual sterile fashion.   Local anesthesia was infiltrated over the proximal portion of the fistula in the left arm.   A micropuncture needle was brought up on the operative field and this was used to directly cannulate the fistula. A micropuncture wire was then threaded into the fistula and the micropuncture sheath threaded over this. Sheath was thoroughly flushed with heparinized saline and the dilator was removed. Contrast angiogram was then performed of the fistula.   The central venous structures are patent. The fistula is patent in its midportion.  There is mild narrowing 30-50 percent over the proximal 3 cm of the fistula.  There is a large competing branch just distal to this that is the same diameter of the main channel of the fistula.  Next, pressure was held on the upper arm in order to reflux contrast across the arterial anastomosis. The arterial anastomosis is patent without narrowing.  A 3-0 Monocryl pursestring stitch was placed around the sheath and the sheath removed. Hemostasis was obtained.  The patient tolerated the procedure well and there were no complications. The patient was taken to the holding area in stable condition.  Operative findings: 35-50% stenosis of the proximal fistula                                Large competing branch 3 cm distal to the anastomosis          Will schedule for possible patch revision of proximal fistula as well as side branch ligation next week  Fabienne Bruns, MD [facility] [place] Office: [phone] Pager: [phone]

## 2012-06-02 ENCOUNTER — Other Ambulatory Visit: Payer: Self-pay | Admitting: *Deleted

## 2012-06-03 ENCOUNTER — Encounter (HOSPITAL_COMMUNITY): Payer: Self-pay | Admitting: Pharmacy Technician

## 2012-06-12 ENCOUNTER — Encounter (HOSPITAL_COMMUNITY): Payer: Self-pay | Admitting: *Deleted

## 2012-06-12 MED ORDER — DEXTROSE 5 % IV SOLN
1.5000 g | INTRAVENOUS | Status: DC
  Filled 2012-06-12: qty 1.5

## 2012-06-13 ENCOUNTER — Encounter (HOSPITAL_COMMUNITY): Payer: Self-pay | Admitting: Anesthesiology

## 2012-06-13 ENCOUNTER — Encounter (HOSPITAL_COMMUNITY): Payer: Self-pay | Admitting: Surgery

## 2012-06-13 ENCOUNTER — Encounter (HOSPITAL_COMMUNITY)
Admission: RE | Disposition: A | Discharge status: Home / Self Care | Payer: Self-pay | Source: Ambulatory Visit | Attending: Vascular Surgery

## 2012-06-13 ENCOUNTER — Telehealth: Payer: Self-pay | Admitting: Vascular Surgery

## 2012-06-13 ENCOUNTER — Ambulatory Visit (HOSPITAL_COMMUNITY)
Admission: RE | Admit: 2012-06-13 | Discharge: 2012-06-13 | Disposition: A | Discharge status: Home / Self Care | Payer: Medicare Other | Source: Ambulatory Visit | Attending: Vascular Surgery | Admitting: Vascular Surgery

## 2012-06-13 ENCOUNTER — Ambulatory Visit (HOSPITAL_COMMUNITY): Payer: Medicare Other | Admitting: Anesthesiology

## 2012-06-13 DIAGNOSIS — Z794 Long term (current) use of insulin: Secondary | ICD-10-CM | POA: Insufficient documentation

## 2012-06-13 DIAGNOSIS — I2510 Atherosclerotic heart disease of native coronary artery without angina pectoris: Secondary | ICD-10-CM | POA: Insufficient documentation

## 2012-06-13 DIAGNOSIS — T82898A Other specified complication of vascular prosthetic devices, implants and grafts, initial encounter: Secondary | ICD-10-CM

## 2012-06-13 DIAGNOSIS — N186 End stage renal disease: Secondary | ICD-10-CM | POA: Insufficient documentation

## 2012-06-13 DIAGNOSIS — T82598A Other mechanical complication of other cardiac and vascular devices and implants, initial encounter: Secondary | ICD-10-CM | POA: Insufficient documentation

## 2012-06-13 DIAGNOSIS — Y849 Medical procedure, unspecified as the cause of abnormal reaction of the patient, or of later complication, without mention of misadventure at the time of the procedure: Secondary | ICD-10-CM | POA: Insufficient documentation

## 2012-06-13 DIAGNOSIS — E119 Type 2 diabetes mellitus without complications: Secondary | ICD-10-CM | POA: Insufficient documentation

## 2012-06-13 DIAGNOSIS — K219 Gastro-esophageal reflux disease without esophagitis: Secondary | ICD-10-CM | POA: Insufficient documentation

## 2012-06-13 DIAGNOSIS — I120 Hypertensive chronic kidney disease with stage 5 chronic kidney disease or end stage renal disease: Secondary | ICD-10-CM | POA: Insufficient documentation

## 2012-06-13 DIAGNOSIS — E039 Hypothyroidism, unspecified: Secondary | ICD-10-CM | POA: Insufficient documentation

## 2012-06-13 HISTORY — DX: Unspecified macular degeneration: H35.30

## 2012-06-13 HISTORY — DX: Gastro-esophageal reflux disease without esophagitis: K21.9

## 2012-06-13 LAB — SURGICAL PCR SCREEN: Staphylococcus aureus: NEGATIVE

## 2012-06-13 LAB — POCT I-STAT 4, (NA,K, GLUC, HGB,HCT)
HCT: 36 % (ref 36.0–46.0)
Hemoglobin: 12.2 g/dL (ref 12.0–15.0)
Sodium: 140 mEq/L (ref 135–145)

## 2012-06-13 LAB — GLUCOSE, CAPILLARY: Glucose-Capillary: 166 mg/dL — ABNORMAL HIGH (ref 70–99)

## 2012-06-13 SURGERY — LIGATION OF COMPETING BRANCHES OF ARTERIOVENOUS FISTULA
Anesthesia: Monitor Anesthesia Care | Site: Arm Lower | Laterality: Left

## 2012-06-13 MED ORDER — LIDOCAINE-EPINEPHRINE (PF) 1 %-1:200000 IJ SOLN
INTRAMUSCULAR | Status: DC | PRN
  Administered 2012-06-13: 30 mL via INTRADERMAL

## 2012-06-13 MED ORDER — LIDOCAINE-EPINEPHRINE (PF) 1 %-1:200000 IJ SOLN
INTRAMUSCULAR | Status: AC
  Filled 2012-06-13: qty 10

## 2012-06-13 MED ORDER — SODIUM CHLORIDE 0.9 % IV SOLN
INTRAVENOUS | Status: DC
  Administered 2012-06-13: 11:00:00 via INTRAVENOUS

## 2012-06-13 MED ORDER — OXYCODONE HCL 5 MG PO TABS: 5.0000 mg | ORAL_TABLET | Freq: Four times a day (QID) | ORAL | Status: DC | PRN

## 2012-06-13 MED ORDER — 0.9 % SODIUM CHLORIDE (POUR BTL) OPTIME
TOPICAL | Status: DC | PRN
  Administered 2012-06-13: 1000 mL

## 2012-06-13 MED ORDER — HEPARIN SODIUM (PORCINE) 1000 UNIT/ML IJ SOLN
INTRAMUSCULAR | Status: DC | PRN
  Administered 2012-06-13: 6000 [IU] via INTRAVENOUS

## 2012-06-13 MED ORDER — SODIUM CHLORIDE 0.9 % IV SOLN
INTRAVENOUS | Status: DC | PRN
  Administered 2012-06-13 (×2): via INTRAVENOUS

## 2012-06-13 MED ORDER — MIDAZOLAM HCL 5 MG/5ML IJ SOLN
INTRAMUSCULAR | Status: DC | PRN
  Administered 2012-06-13: 0.5 mg via INTRAVENOUS

## 2012-06-13 MED ORDER — FENTANYL CITRATE 0.05 MG/ML IJ SOLN
INTRAMUSCULAR | Status: DC | PRN
  Administered 2012-06-13: 25 ug via INTRAVENOUS

## 2012-06-13 MED ORDER — MUPIROCIN 2 % EX OINT
TOPICAL_OINTMENT | Freq: Two times a day (BID) | CUTANEOUS | Status: DC
  Administered 2012-06-13: 1 via NASAL

## 2012-06-13 MED ORDER — MUPIROCIN 2 % EX OINT
TOPICAL_OINTMENT | CUTANEOUS | Status: AC
  Administered 2012-06-13: 1 via NASAL
  Filled 2012-06-13: qty 22

## 2012-06-13 MED ORDER — PROTAMINE SULFATE 10 MG/ML IV SOLN
INTRAVENOUS | Status: DC | PRN
  Administered 2012-06-13 (×3): 10 mg via INTRAVENOUS

## 2012-06-13 MED ORDER — PROPOFOL INFUSION 10 MG/ML OPTIME
INTRAVENOUS | Status: DC | PRN
  Administered 2012-06-13: 25 ug/kg/min via INTRAVENOUS

## 2012-06-13 SURGICAL SUPPLY — 37 items
ADH SKN CLS APL DERMABOND .7 (GAUZE/BANDAGES/DRESSINGS) ×2
CANISTER SUCTION 2500CC (MISCELLANEOUS) ×2 IMPLANT
CLIP TI MEDIUM 6 (CLIP) ×1 IMPLANT
CLOTH BEACON ORANGE TIMEOUT ST (SAFETY) ×2 IMPLANT
COVER SURGICAL LIGHT HANDLE (MISCELLANEOUS) ×2 IMPLANT
DERMABOND ADVANCED (GAUZE/BANDAGES/DRESSINGS) ×2
DERMABOND ADVANCED .7 DNX12 (GAUZE/BANDAGES/DRESSINGS) ×1 IMPLANT
ELECT REM PT RETURN 9FT ADLT (ELECTROSURGICAL) ×2
ELECTRODE REM PT RTRN 9FT ADLT (ELECTROSURGICAL) ×1 IMPLANT
GEL ULTRASOUND 20GR AQUASONIC (MISCELLANEOUS) IMPLANT
GLOVE BIO SURGEON STRL SZ7.5 (GLOVE) ×2 IMPLANT
GLOVE BIOGEL PI IND STRL 6.5 (GLOVE) IMPLANT
GLOVE BIOGEL PI IND STRL 8 (GLOVE) ×1 IMPLANT
GLOVE BIOGEL PI INDICATOR 6.5 (GLOVE) ×2
GLOVE BIOGEL PI INDICATOR 8 (GLOVE) ×1
GLOVE SURG SS PI 7.0 STRL IVOR (GLOVE) ×2 IMPLANT
GOWN PREVENTION PLUS XLARGE (GOWN DISPOSABLE) ×1 IMPLANT
GOWN STRL NON-REIN LRG LVL3 (GOWN DISPOSABLE) ×5 IMPLANT
KIT BASIN OR (CUSTOM PROCEDURE TRAY) ×2 IMPLANT
KIT ROOM TURNOVER OR (KITS) ×2 IMPLANT
NS IRRIG 1000ML POUR BTL (IV SOLUTION) ×2 IMPLANT
PACK CV ACCESS (CUSTOM PROCEDURE TRAY) ×2 IMPLANT
PAD ARMBOARD 7.5X6 YLW CONV (MISCELLANEOUS) ×4 IMPLANT
SPONGE GAUZE 4X4 12PLY (GAUZE/BANDAGES/DRESSINGS) ×2 IMPLANT
SPONGE SURGIFOAM ABS GEL 100 (HEMOSTASIS) IMPLANT
SUT ETHILON 3 0 PS 1 (SUTURE) IMPLANT
SUT PROLENE 6 0 BV (SUTURE) IMPLANT
SUT SILK 0 TIES 10X30 (SUTURE) ×2 IMPLANT
SUT VIC AB 3-0 SH 27 (SUTURE) ×2
SUT VIC AB 3-0 SH 27X BRD (SUTURE) ×1 IMPLANT
SUT VICRYL 4-0 PS2 18IN ABS (SUTURE) ×2 IMPLANT
SWAB COLLECTION DEVICE MRSA (MISCELLANEOUS) IMPLANT
TOWEL OR 17X24 6PK STRL BLUE (TOWEL DISPOSABLE) ×2 IMPLANT
TOWEL OR 17X26 10 PK STRL BLUE (TOWEL DISPOSABLE) ×2 IMPLANT
TUBE ANAEROBIC SPECIMEN COL (MISCELLANEOUS) IMPLANT
UNDERPAD 30X30 INCONTINENT (UNDERPADS AND DIAPERS) ×2 IMPLANT
WATER STERILE IRR 1000ML POUR (IV SOLUTION) ×2 IMPLANT

## 2012-06-13 NOTE — Anesthesia Procedure Notes (Signed)
Procedure Name: MAC Performed by: Carmela Rima Pre-anesthesia Checklist: Patient identified, Emergency Drugs available, Suction available, Patient being monitored and Timeout performed Oxygen Delivery Method: Nasal cannula Placement Confirmation: positive ETCO2

## 2012-06-13 NOTE — Anesthesia Preprocedure Evaluation (Signed)
Anesthesia Evaluation  Patient identified by MRN, date of birth, ID band Patient awake    Reviewed: Allergy & Precautions, H&P , NPO status , Patient's Chart, lab work & pertinent test results, reviewed documented beta blocker date and time   Airway Mallampati: II TM Distance: >3 FB Neck ROM: full    Dental   Pulmonary neg pulmonary ROS,  breath sounds clear to auscultation        Cardiovascular hypertension, + CAD and +CHF + Valvular Problems/Murmurs Rhythm:regular     Neuro/Psych negative neurological ROS  negative psych ROS   GI/Hepatic negative GI ROS, PUD, GERD-  Medicated and Controlled,(+) Cirrhosis -       ,   Endo/Other  diabetes, Insulin DependentHypothyroidism   Renal/GU CRFRenal disease  negative genitourinary   Musculoskeletal   Abdominal   Peds  Hematology negative hematology ROS (+)   Anesthesia Other Findings See surgeon's H&P   Reproductive/Obstetrics negative OB ROS                           Anesthesia Physical Anesthesia Plan  ASA: III  Anesthesia Plan: MAC   Post-op Pain Management:    Induction: Intravenous  Airway Management Planned: Simple Face Mask  Additional Equipment:   Intra-op Plan:   Post-operative Plan:   Informed Consent: I have reviewed the patients History and Physical, chart, labs and discussed the procedure including the risks, benefits and alternatives for the proposed anesthesia with the patient or authorized representative who has indicated his/her understanding and acceptance.   Dental Advisory Given  Plan Discussed with: CRNA and Surgeon  Anesthesia Plan Comments:         Anesthesia Quick Evaluation

## 2012-06-13 NOTE — Anesthesia Postprocedure Evaluation (Signed)
  Anesthesia Post-op Note  Patient: Sara [name]  Procedure(s) Performed: Procedure(s) (LRB) with comments: LIGATION OF COMPETING BRANCHES OF ARTERIOVENOUS FISTULA (Left) -  with patch angioplasty of  proximal fistula.  Patient Location: PACU  Anesthesia Type: MAC  Level of Consciousness: awake, alert  and oriented  Airway and Oxygen Therapy: Patient Spontanous Breathing  Post-op Pain: none  Post-op Assessment: Post-op Vital signs reviewed, Patient's Cardiovascular Status Stable, Respiratory Function Stable, Patent Airway, No signs of Nausea or vomiting, Adequate PO intake and Pain level controlled  Post-op Vital Signs: Reviewed and stable  Complications: No apparent anesthesia complications

## 2012-06-13 NOTE — Transfer of Care (Signed)
Immediate Anesthesia Transfer of Care Note  Patient: Sara [name]  Procedure(s) Performed: Procedure(s) (LRB) with comments: LIGATION OF COMPETING BRANCHES OF ARTERIOVENOUS FISTULA (Left) -  with patch angioplasty of  proximal fistula.  Patient Location: PACU  Anesthesia Type: MAC  Level of Consciousness: awake, alert  and oriented  Airway & Oxygen Therapy: Patient Spontanous Breathing and Patient connected to nasal cannula oxygen  Post-op Assessment: Report given to PACU RN and Post -op Vital signs reviewed and stable  Post vital signs: Reviewed and stable  Complications: No apparent anesthesia complications

## 2012-06-13 NOTE — Interval H&P Note (Signed)
History and Physical Interval Note:  06/13/2012 10:19 AM  Sara [name] T Wuebker  has presented today for surgery, with the diagnosis of ESRD;NON MATURING AVF  The various methods of treatment have been discussed with the patient and family. After consideration of risks, benefits and other options for treatment, the patient has consented to  Procedure(s) (LRB) with comments: LIGATION OF COMPETING BRANCHES OF ARTERIOVENOUS FISTULA (Left) - WITH POSSIBLE PATCH OF PROXIMAL FISTULA as a surgical intervention .  The patient's history has been reviewed, patient examined, no change in status, stable for surgery.  I have reviewed the patient's chart and labs.  Questions were answered to the patient's satisfaction.     Refoel Palladino S

## 2012-06-13 NOTE — OR Nursing (Signed)
(+)   bruit/thrill LUE avgg

## 2012-06-13 NOTE — Telephone Encounter (Signed)
lvm and sent letter, dpm °

## 2012-06-13 NOTE — Telephone Encounter (Signed)
Message copied by Fredrich Birks on Fri Jun 13, 2012  2:54 PM ------      Message from: [place], [place] K      Created: Fri Jun 13, 2012  1:02 PM      Regarding: schedule                   ----- Message -----         From: Dara Lords, PA         Sent: 06/13/2012  12:47 PM           To: Sharee Pimple, CMA            S/p vein patch angioplasty of left AVF (brachiocephalic) and ligation of competing branches.             F/u with [facility] in 3 weeks.            Thanks,      Lelon Mast

## 2012-06-13 NOTE — Preoperative (Signed)
Beta Blockers   Reason not to administer Beta Blockers:Not Applicable 

## 2012-06-13 NOTE — H&P (View-Only) (Signed)
[facility] [place]  Established Dialysis Access  History of Present Illness  Sara [name] is a 78 y.o. (11-14-1924) female who presents for re-evaluation of L BC AVF.  Pt notes difficulties with cannulation during HD.  She had her L BC AVF placed in 12/21/11 by Dr. Hart Rochester.  She denies any steal sx.  She is able to complete ADLs.  Past Medical History, Past Surgical History, Social History, Family History, Medications, Allergies, and Review of Systems are unchanged from previous visit on 02/12/12.  Physical Examination  Filed Vitals:   05/16/12 1122  BP: 115/70  Pulse: 57  Resp: 16  Height: 5\' 3"  (1.6 m)  Weight: 171 lb (77.565 kg)  SpO2: 100%   Body mass index is 30.29 kg/(m^2).  General: A&O x 3, WD, elderly  Pulmonary: Sym exp, good air movt, CTAB, no rales, rhonchi, & wheezing  Cardiac: RRR, Nl S1, S2, no Murmurs, rubs or gallops  Gastrointestinal: soft, NTND, -G/R, - HSM, - masses, - CVAT B  Musculoskeletal: M/S 5/5 throughout , Extremities without  ischemic changes , palpable thrill which attenuates proximally, +bruit  Neurologic: Pain and light touch intact in extremities , Motor exam as listed above  Non-Invasive Vascular Imaging  LUE Access Duplex (Date: 05/16/12):   Possible valvular stenosis at 731 c/s in perianastomotic segment  Rest of fistula 112-143 c/s  Medical Decision Making  Sara [name] is a 78 y.o. female who presents with ESRD requiring hemodialysis, possible valvular stenosis in L BC AVF  I recommended to the patient proceeding with: L arm fistulogram with possible intervention.  This will be scheduled 30 SEP 13. I discussed with the patient the nature of angiographic procedures, especially the limited patencies of any endovascular intervention.  The patient is aware of that the risks of an angiographic procedure include but are not limited to: bleeding, infection, access site complications, renal failure,  embolization, rupture of vessel, dissection, possible need for emergent surgical intervention, possible need for surgical procedures to treat the patient's pathology, and stroke and death.   The patient is aware of the risks and agrees to proceed.  Leonides Sake, MD [facility] [place] Office: [phone] Pager: [phone]  05/16/2012, 4:21 PM

## 2012-06-13 NOTE — Op Note (Signed)
NAME: Sara [name]   MRN: [id number] DOB: 1925/08/03    DATE OF OPERATION: 06/13/2012  PREOP DIAGNOSIS: Poorly maturing left arteriovenous fistula  POSTOP DIAGNOSIS: same  PROCEDURE: ligation of competing branches of left radiocephalic AV fistula and vein patch angioplasty of proximal fistula.  SURGEON: Di Kindle. Edilia Bo, MD, FACS  ASSIST: Doreatha Massed, PA  ANESTHESIA: local with sedation   EBL: minimal  INDICATIONS: [name] is a 78 y.o. female who had a left brachiocephalic AV fistula placed in April. This was slow to mature fistulogram demonstrated some narrowing in the proximal fistula and a large competing branch. She is brought in for revision of her fistula.  FINDINGS: the large competing branch was identified and used as a vein patch. There was intimal hyperplasia in the proximal 2 cm of the fistula.  TECHNIQUE: The patient was taken to the operating room and sedated by anesthesia. The left upper extremity was prepped and draped in usual sterile fashion. The skin was infiltrated with 1% lidocaine a longitudinal incision was made over the competing branch which had been identified. The vein was dissected free and ligated proximally and distally. Was open longitudinally to be used as a vein patch. After the skin was anesthetized, a separate incision was made of the proximal fistula where the proximal fistula was dissected free. The brachial artery proximal and distal to the anastomosis were dissected free. The patient was heparinized. The fistula was clamped and then the brachial artery clamped proximally and distally. A longitudinal arteriotomy was made through the area of stenosis. The vein patch was then sewn using continuous 6-0 Prolene suture. At the completion the clamps released there was excellent thrill in the fistula. The wound closed the deep layer of 3-0 Vicryl and the skin closed with 4-0 Vicryl. Dermabond was applied. The patient tolerated the procedure  well and transferred to the recovery room in stable condition. All needle and sponge counts were correct.  Waverly Ferrari, MD, FACS [facility]  DATE OF DICTATION:   06/13/2012

## 2012-06-18 ENCOUNTER — Encounter (HOSPITAL_COMMUNITY): Payer: Self-pay

## 2012-07-08 ENCOUNTER — Encounter: Payer: Self-pay | Admitting: Vascular Surgery

## 2012-07-09 ENCOUNTER — Encounter: Payer: Self-pay | Admitting: Vascular Surgery

## 2012-07-09 ENCOUNTER — Ambulatory Visit (INDEPENDENT_AMBULATORY_CARE_PROVIDER_SITE_OTHER): Payer: Medicare Other | Admitting: Vascular Surgery

## 2012-07-09 VITALS — BP 145/70 | HR 89 | Resp 18 | Ht 63.0 in | Wt 179.0 lb

## 2012-07-09 DIAGNOSIS — N186 End stage renal disease: Secondary | ICD-10-CM

## 2012-07-09 NOTE — Assessment & Plan Note (Signed)
Her left AV fistula appears to be maturing nicely after ligation of competing branches and vein patch angioplasty of the proximal fistula. At this point I think it would be reasonable to attempt cannulation of her fistula. If the dialysis nurses did not feel comfortable this point then they could wait another 4-6 weeks however currently it appears to be adequate size with an excellent thrill. She will let us know if they have any problems accessing her fistula.

## 2012-07-09 NOTE — Progress Notes (Signed)
[facility] [place]  Patient name: Sara [name] MRN: [id number] DOB: December 20, 1924 Sex: female  REASON FOR VISIT: follow up after revision of left brachiocephalic AV fistula  HPI: Sara [name] is a 78 y.o. female who had a left brachiocephalic fistula placed in April. His was slow to mature and she was set up for a fistulogram which showed a large competing branches the proximal fistula and narrowing in the proximal fistula. She underwent ligation of the competing branch and vein patch angioplasty of the proximal fistula on 06/13/2012. She comes in for a follow up visit. She has had no pain or paresthesias in the left arm.   REVIEW OF SYSTEMS: Arly.Keller ] denotes positive finding; [  ] denotes negative finding  CARDIOVASCULAR:  [ ]  chest pain   [ ]  dyspnea on exertion    CONSTITUTIONAL:  [ ]  fever   [ ]  chills  PHYSICAL EXAM: Filed Vitals:   07/09/12 1311  BP: 145/70  Pulse: 89  Resp: 18  Height: 5\' 3"  (1.6 m)  Weight: 179 lb (81.194 kg)   Body mass index is 31.71 kg/(m^2). GENERAL: The patient is a well-nourished female, in no acute distress. The vital signs are documented above. CARDIOVASCULAR: There is a regular rate and rhythm  PULMONARY: There is good air exchange bilaterally without wheezing or rales. The fistula has an excellent bruit and thrill. It appears to be maturing adequately. Cannot palpate her left radial pulse however the hand is warm and well-perfused.  MEDICAL ISSUES:  End stage renal disease Her left AV fistula appears to be maturing nicely after ligation of competing branches and vein patch angioplasty of the proximal fistula. At this point I think it would be reasonable to attempt cannulation of her fistula. If the dialysis nurses did not feel comfortable this point then they could wait another 4-6 weeks however currently it appears to be adequate size with an excellent thrill. She will let us know if they have any problems accessing her  fistula.   Jefferey Lippmann S [facility] [place] Beeper: [phone]

## 2012-09-30 ENCOUNTER — Other Ambulatory Visit: Payer: Self-pay | Admitting: *Deleted

## 2012-09-30 DIAGNOSIS — T82898A Other specified complication of vascular prosthetic devices, implants and grafts, initial encounter: Secondary | ICD-10-CM

## 2012-09-30 DIAGNOSIS — Z4931 Encounter for adequacy testing for hemodialysis: Secondary | ICD-10-CM

## 2012-09-30 DIAGNOSIS — N186 End stage renal disease: Secondary | ICD-10-CM

## 2012-10-16 ENCOUNTER — Encounter: Payer: Self-pay | Admitting: Vascular Surgery

## 2012-10-17 ENCOUNTER — Encounter: Payer: Self-pay | Admitting: Vascular Surgery

## 2012-10-17 ENCOUNTER — Encounter (INDEPENDENT_AMBULATORY_CARE_PROVIDER_SITE_OTHER): Payer: Medicare Other | Admitting: *Deleted

## 2012-10-17 ENCOUNTER — Ambulatory Visit (INDEPENDENT_AMBULATORY_CARE_PROVIDER_SITE_OTHER): Payer: Medicare Other | Admitting: Vascular Surgery

## 2012-10-17 VITALS — BP 121/45 | HR 74 | Ht 63.0 in | Wt 178.5 lb

## 2012-10-17 DIAGNOSIS — N186 End stage renal disease: Secondary | ICD-10-CM

## 2012-10-17 DIAGNOSIS — T82598A Other mechanical complication of other cardiac and vascular devices and implants, initial encounter: Secondary | ICD-10-CM

## 2012-10-17 DIAGNOSIS — Z4931 Encounter for adequacy testing for hemodialysis: Secondary | ICD-10-CM

## 2012-10-17 DIAGNOSIS — T82898A Other specified complication of vascular prosthetic devices, implants and grafts, initial encounter: Secondary | ICD-10-CM

## 2012-10-17 NOTE — Progress Notes (Signed)
[facility] [place]  Established Dialysis Access  History of Present Illness  Sara [name] is a 79 y.o. (01-20-1925) female who presents for re-evaluation of L BC AVF.    The patient most recently underwent a revision of the proximal anastomosis with side branch ligation on 06/13/12.  Per the patient, they have successful cannulated the fistula once.  They currently run the HD with one needle in the fistula.  Past Medical History, Past Surgical History, Social History, Family History, Medications, Allergies, and Review of Systems are unchanged from previous visit on 07/09/13.  Physical Examination  Filed Vitals:   10/17/12 1135  BP: 121/45  Pulse: 74  Height: 5\' 3"  (1.6 m)  Weight: 178 lb 8 oz (80.967 kg)  SpO2: 100%   Body mass index is 31.63 kg/(m^2).  General: A&O x 3, WDWN  Pulmonary: Sym exp, good air movt, CTAB, no rales, rhonchi, & wheezing  Cardiac: RRR, Nl S1, S2, no Murmurs, rubs or gallops  Gastrointestinal: soft, NTND, -G/R, - HSM, - masses, - CVAT B  Musculoskeletal: M/S 5/5 throughout , Extremities without  ischemic changes , L BC AVF w/ easily palpable thrill and bruit even up to upper arm  Neurologic: Pain and light touch intact in extremities , Motor exam as listed above  Non-Invasive Vascular Imaging  L arm access duplex  (Date: 10/17/12):   Diameter: 6.1-8.4 mm  Depth: 2 - 5.1 mm, upper arm 11.2 mm  Medical Decision Making  Sara [name] is a 79 y.o. female who presents with ESRD requiring hemodialysis.   Based on exam and access duplex, this patient has a widely patent L BC AVF that should be easily cannulated.  Given the difficulty experienced so far, consider a button hole technique for cannulation.    I don't think superficializing this fistula will make cannulation any easier, as it is < 6 mm in depth already for much of its course.  Leonides Sake, MD [facility] [place] Office:  [phone] Pager: [phone]  10/17/2012, 12:18 PM

## 2012-12-23 ENCOUNTER — Encounter (INDEPENDENT_AMBULATORY_CARE_PROVIDER_SITE_OTHER): Payer: Self-pay

## 2012-12-29 ENCOUNTER — Telehealth: Payer: Self-pay | Admitting: Family Medicine

## 2012-12-29 ENCOUNTER — Other Ambulatory Visit: Payer: Self-pay | Admitting: Family Medicine

## 2012-12-29 DIAGNOSIS — G2581 Restless legs syndrome: Secondary | ICD-10-CM

## 2012-12-29 MED ORDER — CYCLOBENZAPRINE HCL 5 MG PO TABS: 5.0000 mg | ORAL_TABLET | Freq: Every evening | ORAL | Status: DC | PRN

## 2012-12-29 NOTE — Telephone Encounter (Signed)
Spoke with pt --pt aware rx called to drug store

## 2012-12-29 NOTE — Telephone Encounter (Signed)
Ordered flexeril in Epic.

## 2013-01-06 ENCOUNTER — Other Ambulatory Visit: Payer: Self-pay | Admitting: Family Medicine

## 2013-01-07 NOTE — Telephone Encounter (Signed)
On med list in chart as 25 mg BID but in  meds in Epic as 1/2 in Am and 1/2 in PM 25 mg

## 2013-01-09 ENCOUNTER — Ambulatory Visit: Payer: Self-pay | Admitting: Family Medicine

## 2013-01-15 ENCOUNTER — Ambulatory Visit (INDEPENDENT_AMBULATORY_CARE_PROVIDER_SITE_OTHER): Payer: Medicare Other | Admitting: Family Medicine

## 2013-01-15 ENCOUNTER — Encounter: Payer: Self-pay | Admitting: Family Medicine

## 2013-01-15 VITALS — BP 123/58 | HR 63 | Temp 97.3°F | Ht 63.0 in | Wt 183.0 lb

## 2013-01-15 DIAGNOSIS — I10 Essential (primary) hypertension: Secondary | ICD-10-CM

## 2013-01-15 DIAGNOSIS — E039 Hypothyroidism, unspecified: Secondary | ICD-10-CM | POA: Insufficient documentation

## 2013-01-15 DIAGNOSIS — E7800 Pure hypercholesterolemia, unspecified: Secondary | ICD-10-CM

## 2013-01-15 DIAGNOSIS — E785 Hyperlipidemia, unspecified: Secondary | ICD-10-CM | POA: Insufficient documentation

## 2013-01-15 DIAGNOSIS — K7460 Unspecified cirrhosis of liver: Secondary | ICD-10-CM

## 2013-01-15 DIAGNOSIS — E119 Type 2 diabetes mellitus without complications: Secondary | ICD-10-CM | POA: Insufficient documentation

## 2013-01-15 DIAGNOSIS — K769 Liver disease, unspecified: Secondary | ICD-10-CM

## 2013-01-15 DIAGNOSIS — N186 End stage renal disease: Secondary | ICD-10-CM

## 2013-01-15 DIAGNOSIS — G2581 Restless legs syndrome: Secondary | ICD-10-CM | POA: Insufficient documentation

## 2013-01-15 LAB — LIPID PANEL
Cholesterol: 93 mg/dL (ref 0–200)
HDL: 38 mg/dL — ABNORMAL LOW (ref >39)
LDL Cholesterol: 33 mg/dL (ref 0–99)
Total CHOL/HDL Ratio: 2.4 Ratio
Triglycerides: 110 mg/dL (ref <150)
VLDL: 22 mg/dL (ref 0–40)

## 2013-01-15 LAB — HEPATIC FUNCTION PANEL
ALT: 17 U/L (ref 0–35)
AST: 19 U/L (ref 0–37)
Albumin: 3.9 g/dL (ref 3.5–5.2)
Alkaline Phosphatase: 156 U/L — ABNORMAL HIGH (ref 39–117)
Bilirubin, Direct: 0.1 mg/dL (ref 0.0–0.3)
Indirect Bilirubin: 0.5 mg/dL (ref 0.0–0.9)
Total Bilirubin: 0.6 mg/dL (ref 0.3–1.2)
Total Protein: 6.3 g/dL (ref 6.0–8.3)

## 2013-01-15 LAB — TSH: TSH: 0.723 u[IU]/mL (ref 0.350–4.500)

## 2013-01-15 MED ORDER — GLIMEPIRIDE 4 MG PO TABS: 4.0000 mg | ORAL_TABLET | Freq: Every day | ORAL | Status: DC

## 2013-01-15 MED ORDER — CYCLOBENZAPRINE HCL 5 MG PO TABS: 5.0000 mg | ORAL_TABLET | Freq: Every evening | ORAL | Status: DC | PRN

## 2013-01-15 NOTE — Progress Notes (Signed)
Patient ID: Sara [name], female   DOB: 28-May-1925, 79 y.o.   MRN: [id number] SUBJECTIVE: HPI: Patient is here for follow up of Diabetes MellitusHLD and other medical problems.: Symptoms of DM: Denies Nocturia ,Denies Urinary Frequency , denies Blurred vision ,deniesDizziness,denies.Dysuria,denies paresthesias, denies extremity pain or ulcers.Marland Kitchendenies chest pain. has had an annual eye exam. do check the feet. Does check CBGs. Average CBG: Denies episodes of hypoglycemia. Does have an emergency hypoglycemic plan. admits toCompliance with medications. Denies Problems with medications. ON dialysis. Patient is well cognizant of her health. Results from dialysis looks good. HGBA1C at goal.   PMH/PSH: reviewed/updated in Epic  SH/FH: reviewed/updated in Epic  Allergies: reviewed/updated in Epic  Medications: reviewed/updated in Epic  Immunizations: reviewed/updated in Epic  ROS: As above in the HPI. All other systems are stable or negative.  OBJECTIVE: APPEARANCE:  Patient in no acute distress.The patient appeared well nourished and normally developed. Acyanotic. Waist: VITAL SIGNS:BP 123/58  Pulse 63  Temp(Src) 97.3 F (36.3 C) (Oral)  Ht 5\' 3"  (1.6 m)  Wt 183 lb (83.008 kg)  BMI 32.43 kg/m2 elderly frail WF Ambulates with an appliance.: cane  SKIN: warm and  Dry without overt rashes, tattoos and scars  HEAD and Neck: without JVD, Head and scalp: normal Eyes:No scleral icterus. Fundi normal, eye movements normal. Ears: Auricle normal, canal normal, Tympanic membranes normal, insufflation normal. Nose: normal Throat: normal Neck & thyroid: normal  CHEST & LUNGS: Chest wall: normal Lungs: Clear  CVS: Reveals the PMI to be normally located. Regular rhythm, First and Second Heart sounds are normal,  2/6 soft ejection murmur. no, rubs or gallops.  Peripheral vasculature: Radial pulses: normal Dorsal pedis pulses: normal  ABDOMEN:  Appearance:  normal Benign,, no organomegaly, no masses, no Abdominal Aortic enlargement. No Guarding , no rebound. No Bruits. Bowel sounds: normal  RECTAL: N/A GU: N/A  EXTREMETIES: nonedematous. Both Femoral and Pedal pulses are normal.   NEUROLOGIC: oriented to time,place and person; nonfocal.   ASSESSMENT: Restless legs syndrome - Plan: cyclobenzaprine (FLEXERIL) 5 MG tablet  DM (diabetes mellitus) - Plan: glimepiride (AMARYL) 4 MG tablet  HLD (hyperlipidemia) - Plan: Hepatic function panel, Lipid panel  Unspecified hypothyroidism - Plan: TSH  Liver disease  Hypertension  High cholesterol  End stage renal disease  Cirrhosis    PLAN:    Orders Placed This Encounter  Procedures  . TSH  . Hepatic function panel  . Lipid panel   Meds ordered this encounter  Medications  . cyclobenzaprine (FLEXERIL) 5 MG tablet    Sig: Take 1 tablet (5 mg total) by mouth at bedtime as needed for muscle spasms.    Dispense:  90 tablet    Refill:  1  . glimepiride (AMARYL) 4 MG tablet    Sig: Take 1 tablet (4 mg total) by mouth daily before breakfast.    Dispense:  90 tablet    Refill:  3   Keep active, RTC in 4 months  Mindel Friscia P. Modesto Charon, M.D.

## 2013-01-17 NOTE — Progress Notes (Signed)
Quick Note:  Lab result at goal. No change in Medications for now. No Change in plans and follow up. ______ 

## 2013-03-16 ENCOUNTER — Other Ambulatory Visit: Payer: Self-pay | Admitting: Family Medicine

## 2013-04-13 ENCOUNTER — Other Ambulatory Visit: Payer: Self-pay | Admitting: Family Medicine

## 2013-04-15 NOTE — Telephone Encounter (Signed)
EPIC HAS 20U AT QHS LAST REFILL HAD 25U QHS.

## 2013-04-28 ENCOUNTER — Telehealth: Payer: Self-pay

## 2013-04-28 DIAGNOSIS — T82898A Other specified complication of vascular prosthetic devices, implants and grafts, initial encounter: Secondary | ICD-10-CM

## 2013-04-28 DIAGNOSIS — N186 End stage renal disease: Secondary | ICD-10-CM

## 2013-04-28 NOTE — Telephone Encounter (Signed)
Nurse from the dialysis center called to report difficult cannulation with left upper arm AVF.  Reports that there was an infiltration on Saturday, during treatment.  Pt was brought back on Monday for a partial treatment, and again today for treatment.  Reports a positive bruit and thrill @ left UA AVF site.  States there is swelling and some bruising at site.  States there is difficult cannulation on the arterial side.  Requesting an appt.  Discussed with Dr. Hart Rochester.  Rec'd v.o. For ultrasound of left arm AVF along with office visit.  Appt. given to nurse for 04/29/13 @ 12:00 PM Vasc. study, and 12:45 PM for MD appt.  Stated she will give pt. the information.

## 2013-04-29 ENCOUNTER — Encounter (INDEPENDENT_AMBULATORY_CARE_PROVIDER_SITE_OTHER): Payer: Medicare Other | Admitting: *Deleted

## 2013-04-29 ENCOUNTER — Encounter: Payer: Self-pay | Admitting: Vascular Surgery

## 2013-04-29 ENCOUNTER — Ambulatory Visit (INDEPENDENT_AMBULATORY_CARE_PROVIDER_SITE_OTHER): Payer: Medicare Other | Admitting: Vascular Surgery

## 2013-04-29 ENCOUNTER — Other Ambulatory Visit: Payer: Self-pay | Admitting: *Deleted

## 2013-04-29 ENCOUNTER — Encounter (HOSPITAL_COMMUNITY): Payer: Self-pay | Admitting: *Deleted

## 2013-04-29 VITALS — BP 119/52 | HR 68 | Temp 97.6°F | Resp 16 | Ht 63.0 in | Wt 196.0 lb

## 2013-04-29 DIAGNOSIS — N186 End stage renal disease: Secondary | ICD-10-CM

## 2013-04-29 DIAGNOSIS — T82598A Other mechanical complication of other cardiac and vascular devices and implants, initial encounter: Secondary | ICD-10-CM

## 2013-04-29 DIAGNOSIS — T82898A Other specified complication of vascular prosthetic devices, implants and grafts, initial encounter: Secondary | ICD-10-CM

## 2013-04-29 MED ORDER — DEXTROSE 5 % IV SOLN
1.5000 g | INTRAVENOUS | Status: AC
  Administered 2013-04-30: 1.5 g via INTRAVENOUS
  Filled 2013-04-29: qty 1.5

## 2013-04-29 NOTE — Progress Notes (Signed)
Patient name: Sara [name] MRN: [id number] DOB: October 15, 1924 Sex: female  REASON FOR VISIT: Evaluate left upper arm AV fistula  HPI: Sara [name] is a 79 y.o. female who has a left brachiocephalic AV fistula. In October of 2013 I revise this by ligating a large competing branches and performing a patch angioplasty of the proximal fistula. The fistula has been working well. Last time she was seen in follow up for depth of the fistula was very reasonable at less than 0.6 cm in the fistula is working well. Apparently in the last week she's had an infiltrate in her left upper arm and now the fistula is very difficult to cannulate. She was sent to have this evaluated. She dialyzes Tuesdays Thursdays and Saturdays in a [place].  She denies any recent uremic symptoms. She denies nausea, vomiting, fatigue, or anorexia.  REVIEW OF SYSTEMS: Arly.Keller ] denotes positive finding; [  ] denotes negative finding  CARDIOVASCULAR:  [ ]  chest pain   [ ]  dyspnea on exertion    CONSTITUTIONAL:  [ ]  fever   [ ]  chills  PHYSICAL EXAM: Filed Vitals:   04/29/13 1249  BP: 119/52  Pulse: 68  Temp: 97.6 F (36.4 C)  TempSrc: Oral  Resp: 16  Height: 5\' 3"  (1.6 m)  Weight: 196 lb (88.905 kg)  SpO2: 98%   Body mass index is 34.73 kg/(m^2). GENERAL: The patient is a well-nourished female, in no acute distress. The vital signs are documented above. CARDIOVASCULAR: There is a regular rate and rhythm. PULMONARY: There is good air exchange bilaterally without wheezing or rales. This patient has significant upper arm swelling and at the most is from her previous infiltrate. It is difficult to palpate a thrill in her fistula. She does have an audible bruit.  I have independently interpreted her duplex of her fistula. The depth is now to a half centimeters which is related to her infiltrate and swelling. In addition there is an increased velocities at the proximal anastomosis and also in the outflow vein.  MEDICAL  ISSUES: Currently I do not think the upper arm fistula can be accessed because of the large infiltrate. Think once his swelling has resolved she should have a fistulogram and we could potentially salvage her fistula by addressing the outflow stenosis and proximal doses. He can also assess the one branch that was identified and duplex. However at this point I do not think it is safe to cannulate her fistula because of the swelling. I've recommended we place a tunneled dialysis catheter for now and will arrange to do that tomorrow. Once her swelling has resolved we can proceed with a fistulogram. He dialyzes Tuesdays Thursdays and Saturdays in [place]. However we will need to proceed tomorrow as currently she has no way to get dialyzed otherwise.  Zela Sobieski S [facility] [place] Beeper: [phone]

## 2013-04-30 ENCOUNTER — Encounter (HOSPITAL_COMMUNITY): Payer: Self-pay | Admitting: *Deleted

## 2013-04-30 ENCOUNTER — Other Ambulatory Visit: Payer: Medicare Other | Admitting: *Deleted

## 2013-04-30 ENCOUNTER — Ambulatory Visit (HOSPITAL_COMMUNITY): Payer: Medicare Other

## 2013-04-30 ENCOUNTER — Telehealth: Payer: Self-pay | Admitting: Vascular Surgery

## 2013-04-30 ENCOUNTER — Encounter (HOSPITAL_COMMUNITY)
Admission: RE | Disposition: A | Discharge status: Home / Self Care | Payer: Self-pay | Source: Ambulatory Visit | Attending: Vascular Surgery

## 2013-04-30 ENCOUNTER — Other Ambulatory Visit: Payer: Self-pay | Admitting: *Deleted

## 2013-04-30 ENCOUNTER — Ambulatory Visit (HOSPITAL_COMMUNITY)
Admission: RE | Admit: 2013-04-30 | Discharge: 2013-04-30 | Disposition: A | Discharge status: Home / Self Care | Payer: Medicare Other | Source: Ambulatory Visit | Attending: Vascular Surgery | Admitting: Vascular Surgery

## 2013-04-30 ENCOUNTER — Encounter (HOSPITAL_COMMUNITY): Payer: Self-pay | Admitting: Anesthesiology

## 2013-04-30 ENCOUNTER — Ambulatory Visit (HOSPITAL_COMMUNITY): Payer: Medicare Other | Admitting: Anesthesiology

## 2013-04-30 DIAGNOSIS — T82898A Other specified complication of vascular prosthetic devices, implants and grafts, initial encounter: Secondary | ICD-10-CM | POA: Insufficient documentation

## 2013-04-30 DIAGNOSIS — Y832 Surgical operation with anastomosis, bypass or graft as the cause of abnormal reaction of the patient, or of later complication, without mention of misadventure at the time of the procedure: Secondary | ICD-10-CM | POA: Insufficient documentation

## 2013-04-30 DIAGNOSIS — Y929 Unspecified place or not applicable: Secondary | ICD-10-CM | POA: Insufficient documentation

## 2013-04-30 DIAGNOSIS — N186 End stage renal disease: Secondary | ICD-10-CM

## 2013-04-30 DIAGNOSIS — D649 Anemia, unspecified: Secondary | ICD-10-CM | POA: Insufficient documentation

## 2013-04-30 DIAGNOSIS — I871 Compression of vein: Secondary | ICD-10-CM | POA: Insufficient documentation

## 2013-04-30 DIAGNOSIS — E039 Hypothyroidism, unspecified: Secondary | ICD-10-CM | POA: Insufficient documentation

## 2013-04-30 DIAGNOSIS — R222 Localized swelling, mass and lump, trunk: Secondary | ICD-10-CM

## 2013-04-30 DIAGNOSIS — I120 Hypertensive chronic kidney disease with stage 5 chronic kidney disease or end stage renal disease: Secondary | ICD-10-CM | POA: Insufficient documentation

## 2013-04-30 DIAGNOSIS — I2510 Atherosclerotic heart disease of native coronary artery without angina pectoris: Secondary | ICD-10-CM | POA: Insufficient documentation

## 2013-04-30 DIAGNOSIS — E119 Type 2 diabetes mellitus without complications: Secondary | ICD-10-CM | POA: Insufficient documentation

## 2013-04-30 DIAGNOSIS — K219 Gastro-esophageal reflux disease without esophagitis: Secondary | ICD-10-CM | POA: Insufficient documentation

## 2013-04-30 DIAGNOSIS — I509 Heart failure, unspecified: Secondary | ICD-10-CM | POA: Insufficient documentation

## 2013-04-30 HISTORY — PX: INSERTION OF DIALYSIS CATHETER: SHX1324

## 2013-04-30 LAB — POCT I-STAT 4, (NA,K, GLUC, HGB,HCT)
Glucose, Bld: 127 mg/dL — ABNORMAL HIGH (ref 70–99)
Glucose, Bld: 127 mg/dL — ABNORMAL HIGH (ref 70–99)
HCT: 24 % — ABNORMAL LOW (ref 36.0–46.0)
Hemoglobin: 8.5 g/dL — ABNORMAL LOW (ref 12.0–15.0)
Potassium: 4.4 mEq/L (ref 3.5–5.1)

## 2013-04-30 LAB — GLUCOSE, CAPILLARY
Glucose-Capillary: 117 mg/dL — ABNORMAL HIGH (ref 70–99)
Glucose-Capillary: 116 mg/dL — ABNORMAL HIGH (ref 70–99)

## 2013-04-30 SURGERY — INSERTION OF DIALYSIS CATHETER
Anesthesia: Monitor Anesthesia Care | Site: Neck | Wound class: Clean

## 2013-04-30 MED ORDER — PROPOFOL 10 MG/ML IV BOLUS
INTRAVENOUS | Status: DC | PRN
  Administered 2013-04-30: 30 mg via INTRAVENOUS
  Administered 2013-04-30 (×4): 10 mg via INTRAVENOUS

## 2013-04-30 MED ORDER — LIDOCAINE HCL (CARDIAC) 20 MG/ML IV SOLN
INTRAVENOUS | Status: DC | PRN
  Administered 2013-04-30: 100 mg via INTRAVENOUS

## 2013-04-30 MED ORDER — LIDOCAINE HCL (PF) 1 % IJ SOLN
INTRAMUSCULAR | Status: AC
  Filled 2013-04-30: qty 30

## 2013-04-30 MED ORDER — FENTANYL CITRATE 0.05 MG/ML IJ SOLN: 25.0000 ug | INTRAMUSCULAR | Status: DC | PRN

## 2013-04-30 MED ORDER — LIDOCAINE HCL (PF) 1 % IJ SOLN
INTRAMUSCULAR | Status: DC | PRN
  Administered 2013-04-30: 30 mL

## 2013-04-30 MED ORDER — HEPARIN SODIUM (PORCINE) 1000 UNIT/ML IJ SOLN
INTRAMUSCULAR | Status: AC
  Filled 2013-04-30: qty 1

## 2013-04-30 MED ORDER — HEPARIN SODIUM (PORCINE) 1000 UNIT/ML IJ SOLN
INTRAMUSCULAR | Status: DC | PRN
  Administered 2013-04-30: 10 mL

## 2013-04-30 MED ORDER — SODIUM CHLORIDE 0.9 % IR SOLN
Status: DC | PRN
  Administered 2013-04-30: 10:00:00

## 2013-04-30 MED ORDER — PROMETHAZINE HCL 25 MG/ML IJ SOLN: 6.2500 mg | INTRAMUSCULAR | Status: DC | PRN

## 2013-04-30 MED ORDER — PHENYLEPHRINE HCL 10 MG/ML IJ SOLN
INTRAMUSCULAR | Status: DC | PRN
  Administered 2013-04-30: 160 ug via INTRAVENOUS
  Administered 2013-04-30: 80 ug via INTRAVENOUS

## 2013-04-30 MED ORDER — 0.9 % SODIUM CHLORIDE (POUR BTL) OPTIME
TOPICAL | Status: DC | PRN
  Administered 2013-04-30: 1000 mL

## 2013-04-30 MED ORDER — MIDAZOLAM HCL 5 MG/5ML IJ SOLN
INTRAMUSCULAR | Status: DC | PRN
  Administered 2013-04-30: 1 mg via INTRAVENOUS

## 2013-04-30 MED ORDER — SODIUM CHLORIDE 0.9 % IV SOLN
INTRAVENOUS | Status: DC
  Administered 2013-04-30: 07:00:00 via INTRAVENOUS

## 2013-04-30 SURGICAL SUPPLY — 47 items
BAG BANDED W/RUBBER/TAPE 36X54 (MISCELLANEOUS) ×1 IMPLANT
BAG DECANTER FOR FLEXI CONT (MISCELLANEOUS) ×2 IMPLANT
BAG EQP BAND 135X91 W/RBR TAPE (MISCELLANEOUS) ×1
CATH CANNON HEMO 15F 50CM (CATHETERS) IMPLANT
CATH CANNON HEMO 15FR 19 (HEMODIALYSIS SUPPLIES) ×1 IMPLANT
CATH CANNON HEMO 15FR 23CM (HEMODIALYSIS SUPPLIES) ×1 IMPLANT
CATH CANNON HEMO 15FR 31CM (HEMODIALYSIS SUPPLIES) IMPLANT
CATH CANNON HEMO 15FR 32 (HEMODIALYSIS SUPPLIES) IMPLANT
CATH CANNON HEMO 15FR 32CM (HEMODIALYSIS SUPPLIES) IMPLANT
CLOTH BEACON ORANGE TIMEOUT ST (SAFETY) ×2 IMPLANT
COVER DOME SNAP 22 D (MISCELLANEOUS) ×1 IMPLANT
COVER PROBE W GEL 5X96 (DRAPES) IMPLANT
COVER SURGICAL LIGHT HANDLE (MISCELLANEOUS) ×2 IMPLANT
DECANTER SPIKE VIAL GLASS SM (MISCELLANEOUS) ×1 IMPLANT
DRAPE C-ARM 42X72 X-RAY (DRAPES) ×1 IMPLANT
DRAPE CHEST BREAST 15X10 FENES (DRAPES) ×2 IMPLANT
GAUZE SPONGE 2X2 8PLY STRL LF (GAUZE/BANDAGES/DRESSINGS) ×1 IMPLANT
GAUZE SPONGE 4X4 16PLY XRAY LF (GAUZE/BANDAGES/DRESSINGS) ×2 IMPLANT
GLOVE BIO SURGEON STRL SZ7.5 (GLOVE) ×1 IMPLANT
GLOVE BIOGEL PI IND STRL 8 (GLOVE) IMPLANT
GLOVE BIOGEL PI INDICATOR 8 (GLOVE) ×2
GLOVE SS BIOGEL STRL SZ 7 (GLOVE) ×1 IMPLANT
GLOVE SUPERSENSE BIOGEL SZ 7 (GLOVE) ×1
GOWN STRL NON-REIN LRG LVL3 (GOWN DISPOSABLE) ×5 IMPLANT
KIT BASIN OR (CUSTOM PROCEDURE TRAY) ×2 IMPLANT
KIT ROOM TURNOVER OR (KITS) ×2 IMPLANT
NDL 18GX1X1/2 (RX/OR ONLY) (NEEDLE) ×1 IMPLANT
NDL HYPO 25GX1X1/2 BEV (NEEDLE) ×1 IMPLANT
NEEDLE 18GX1X1/2 (RX/OR ONLY) (NEEDLE) ×2 IMPLANT
NEEDLE 22X1 1/2 (OR ONLY) (NEEDLE) ×2 IMPLANT
NEEDLE HYPO 25GX1X1/2 BEV (NEEDLE) ×2 IMPLANT
NS IRRIG 1000ML POUR BTL (IV SOLUTION) ×2 IMPLANT
PACK SURGICAL SETUP 50X90 (CUSTOM PROCEDURE TRAY) ×2 IMPLANT
PAD ARMBOARD 7.5X6 YLW CONV (MISCELLANEOUS) ×4 IMPLANT
SOAP 2 % CHG 4 OZ (WOUND CARE) ×2 IMPLANT
SPONGE GAUZE 2X2 STER 10/PKG (GAUZE/BANDAGES/DRESSINGS) ×1
SUT ETHILON 3 0 PS 1 (SUTURE) ×2 IMPLANT
SUT VICRYL 4-0 PS2 18IN ABS (SUTURE) ×2 IMPLANT
SYR 20CC LL (SYRINGE) ×2 IMPLANT
SYR 30ML LL (SYRINGE) IMPLANT
SYR 5ML LL (SYRINGE) ×4 IMPLANT
SYR CONTROL 10ML LL (SYRINGE) ×2 IMPLANT
SYRINGE 10CC LL (SYRINGE) ×2 IMPLANT
TAPE CLOTH SURG 4X10 WHT LF (GAUZE/BANDAGES/DRESSINGS) ×1 IMPLANT
TOWEL OR 17X24 6PK STRL BLUE (TOWEL DISPOSABLE) ×2 IMPLANT
TOWEL OR 17X26 10 PK STRL BLUE (TOWEL DISPOSABLE) ×2 IMPLANT
WATER STERILE IRR 1000ML POUR (IV SOLUTION) ×1 IMPLANT

## 2013-04-30 NOTE — OR Nursing (Signed)
Istat hgb 8.5 / this am 8.2

## 2013-04-30 NOTE — Progress Notes (Signed)
Was called about post operative CXR s/p diatek catheter placement today with the results as follows:  IMPRESSION:  Left split type catheter has been placed with the tips in the  region of the mid to distal superior vena cava. No gross  pneumothorax.  Fullness of the right paratracheal region. This is new from the  recent examination and possibly related to projection however,  postprocedure hematoma not excluded particularly if a right-sided  central line attempt was performed. This can be assessed on follow-  up.  Dr. Hart Rochester did attempt to access the right IJ for placement, but could not pass the wire and pressure was held for 10 minutes.    The pt is asymptomatic with BP/VSS stable and O2 sats are 99% on RA.  Her preoperative Hgb/Hct were 8.2/24.  This was checked after the CXR and was 8.5/25.  After speaking with Dr. Edilia Bo and Dr. Lelon Perla the pt is asymptomatic and hgb is improved and this is a low pressure system, she will be discharged today.  She is to follow up with Dr. Edilia Bo in 4 weeks to evaluate her AVF.  Before her appt that day, we will have her go have a PA and Lat CXR to re-evaluate this to see if it is improving or possibly a new process.  This was discussed with the pt and her family member.  They express understanding and are in agreement.  Doreatha Massed 04/30/2013 2:03 PM

## 2013-04-30 NOTE — Telephone Encounter (Signed)
Called pt - VM not set up. Sent letter - Marylu Lund

## 2013-04-30 NOTE — Progress Notes (Signed)
CXR report communicated to [facility] PA.  Pt. Asymptomatic.  H/H ordered.

## 2013-04-30 NOTE — Anesthesia Postprocedure Evaluation (Signed)
  Anesthesia Post-op Note  Patient: Sara [name]  Procedure(s) Performed: Procedure(s): INSERTION OF DIALYSIS CATHETER; ULTRASOUND GUIDED (N/A)  Patient Location: PACU  Anesthesia Type:MAC  Level of Consciousness: awake and alert   Airway and Oxygen Therapy: Patient Spontanous Breathing  Post-op Pain: none  Post-op Assessment: Post-op Vital signs reviewed  Post-op Vital Signs: stable  Complications: No apparent anesthesia complications

## 2013-04-30 NOTE — Transfer of Care (Signed)
Immediate Anesthesia Transfer of Care Note  Patient: [name]  Procedure(s) Performed: Procedure(s): INSERTION OF DIALYSIS CATHETER; ULTRASOUND GUIDED (N/A)  Patient Location: PACU  Anesthesia Type:MAC  Level of Consciousness: awake, alert , oriented and patient cooperative  Airway & Oxygen Therapy: Patient Spontanous Breathing  Post-op Assessment: Report given to PACU RN, Post -op Vital signs reviewed and stable and Patient moving all extremities  Post vital signs: Reviewed and stable  Complications: No apparent anesthesia complications

## 2013-04-30 NOTE — OR Nursing (Signed)
Sam/vasc pa notified of hgb 8.5/ will see in phase 2

## 2013-04-30 NOTE — Interval H&P Note (Signed)
History and Physical Interval Note:  04/30/2013 9:02 AM  Sara [name]  has presented today for surgery, with the diagnosis of ESRD  The various methods of treatment have been discussed with the patient and family. After consideration of risks, benefits and other options for treatment, the patient has consented to  Procedure(s): INSERTION OF DIALYSIS CATHETER (N/A) as a surgical intervention .  The patient's history has been reviewed, patient examined, no change in status, stable for surgery.  I have reviewed the patient's chart and labs.  Questions were answered to the patient's satisfaction.     Josephina Gip

## 2013-04-30 NOTE — Telephone Encounter (Signed)
Patient's voicemail is not set up yet. LVM for son to ask pt to call us. Must speak with Olegario Messier before appointment.

## 2013-04-30 NOTE — H&P (View-Only) (Signed)
Patient name: Sara [name] MRN: [id number] DOB: 08/21/1925 Sex: female  REASON FOR VISIT: Evaluate left upper arm AV fistula  HPI: Sara [name] is a 79 y.o. female who has a left brachiocephalic AV fistula. In October of 2013 I revise this by ligating a large competing branches and performing a patch angioplasty of the proximal fistula. The fistula has been working well. Last time she was seen in follow up for depth of the fistula was very reasonable at less than 0.6 cm in the fistula is working well. Apparently in the last week she's had an infiltrate in her left upper arm and now the fistula is very difficult to cannulate. She was sent to have this evaluated. She dialyzes Tuesdays Thursdays and Saturdays in a [place].  She denies any recent uremic symptoms. She denies nausea, vomiting, fatigue, or anorexia.  REVIEW OF SYSTEMS: [X ] denotes positive finding; [  ] denotes negative finding  CARDIOVASCULAR:  [ ] chest pain   [ ] dyspnea on exertion    CONSTITUTIONAL:  [ ] fever   [ ] chills  PHYSICAL EXAM: Filed Vitals:   04/29/13 1249  BP: 119/52  Pulse: 68  Temp: 97.6 F (36.4 C)  TempSrc: Oral  Resp: 16  Height: 5' 3" (1.6 m)  Weight: 196 lb (88.905 kg)  SpO2: 98%   Body mass index is 34.73 kg/(m^2). GENERAL: The patient is a well-nourished female, in no acute distress. The vital signs are documented above. CARDIOVASCULAR: There is a regular rate and rhythm. PULMONARY: There is good air exchange bilaterally without wheezing or rales. This patient has significant upper arm swelling and at the most is from her previous infiltrate. It is difficult to palpate a thrill in her fistula. She does have an audible bruit.  I have independently interpreted her duplex of her fistula. The depth is now to a half centimeters which is related to her infiltrate and swelling. In addition there is an increased velocities at the proximal anastomosis and also in the outflow vein.  MEDICAL  ISSUES: Currently I do not think the upper arm fistula can be accessed because of the large infiltrate. Think once his swelling has resolved she should have a fistulogram and we could potentially salvage her fistula by addressing the outflow stenosis and proximal doses. He can also assess the one branch that was identified and duplex. However at this point I do not think it is safe to cannulate her fistula because of the swelling. I've recommended we place a tunneled dialysis catheter for now and will arrange to do that tomorrow. Once her swelling has resolved we can proceed with a fistulogram. He dialyzes Tuesdays Thursdays and Saturdays in [place]. However we will need to proceed tomorrow as currently she has no way to get dialyzed otherwise.  Elycia Woodside S [facility] Beeper: [phone]     

## 2013-04-30 NOTE — Op Note (Signed)
OPERATIVE REPORT  Date of Surgery: 04/30/2013  Surgeon: Josephina Gip, MD  Assistant: Nurse  Pre-op Diagnosis: End Stage Renal Disease  Post-op Diagnosis: End Stage Renal Disease  Procedure: Procedure(s): #1 bilateral ultrasound localization of the internal jugular veins #2 insertion of hemodialysis catheter via left IJ  Anesthesia: MAC  EBL: Minimal  Complications: None  Procedure Details: Patient was taken to the operating room placed in supine position at which time the upper chest and neck was exposed both internal jugular veins are imaged using B-mode ultrasound using the SonoSite. Both appeared to be patent left being larger than the right. Following this the upper chest and neck were prepped with Betadine scrub and solution draped in routine sterile manner. After infiltration of 1% Xylocaine attempt was made entered the right IJ. I did enter the vein the wire would not pass centrally therefore abandoned the right side and went to the left side left IJ was entered in a supraclavicular fashion. Guidewire was then passed into the right atrium under fluoroscopic guidance without difficulty. After dilating the track appropriately a 23 cm Diateck catheter was passed through peel-away sheath positioned in the right atrium tongue peripherally and secured with nylon sutures. The wound was closed with Vicryl in a subcuticular fashion sterile dressing applied patient taken to the recovery room in stable condition   Josephina Gip, MD 04/30/2013 10:47 AM

## 2013-04-30 NOTE — Telephone Encounter (Signed)
Message copied by Margaretmary Eddy on Thu Apr 30, 2013 10:36 AM ------      Message from: Melene Plan      Created: Thu Apr 30, 2013  9:59 AM                   ----- Message -----         From: Marlowe Shores, PA-C         Sent: 04/30/2013   9:49 AM           To: Melene Plan, RN, [facility]            4-6 weeks with Dr. Edilia Bo - may need fistulogram scheduled after this appt ------

## 2013-04-30 NOTE — Anesthesia Preprocedure Evaluation (Signed)
Anesthesia Evaluation  Patient identified by MRN, date of birth, ID band Patient awake    Reviewed: Allergy & Precautions, NPO status   History of Anesthesia Complications Negative for: history of anesthetic complications  Airway Mallampati: I      Dental   Pulmonary neg pulmonary ROS,          Cardiovascular hypertension, + CAD and +CHF Rate:Normal     Neuro/Psych    GI/Hepatic PUD, GERD-  ,  Endo/Other  diabetesHypothyroidism   Renal/GU ESRFRenal disease     Musculoskeletal   Abdominal   Peds  Hematology  (+) Blood dyscrasia, anemia ,   Anesthesia Other Findings   Reproductive/Obstetrics                           Anesthesia Physical Anesthesia Plan  ASA: IV  Anesthesia Plan: MAC   Post-op Pain Management:    Induction:   Airway Management Planned: Simple Face Mask  Additional Equipment:   Intra-op Plan:   Post-operative Plan:   Informed Consent:   Plan Discussed with: CRNA and Surgeon  Anesthesia Plan Comments:         Anesthesia Quick Evaluation

## 2013-05-01 ENCOUNTER — Encounter (HOSPITAL_COMMUNITY): Payer: Self-pay | Admitting: Vascular Surgery

## 2013-05-14 ENCOUNTER — Ambulatory Visit: Payer: Self-pay | Admitting: Family Medicine

## 2013-05-15 ENCOUNTER — Ambulatory Visit (INDEPENDENT_AMBULATORY_CARE_PROVIDER_SITE_OTHER): Payer: Medicare Other | Admitting: Family Medicine

## 2013-05-15 ENCOUNTER — Encounter: Payer: Self-pay | Admitting: Family Medicine

## 2013-05-15 VITALS — BP 95/56 | HR 68 | Temp 97.0°F | Ht 63.0 in | Wt 192.8 lb

## 2013-05-15 DIAGNOSIS — K7460 Unspecified cirrhosis of liver: Secondary | ICD-10-CM

## 2013-05-15 DIAGNOSIS — N186 End stage renal disease: Secondary | ICD-10-CM

## 2013-05-15 DIAGNOSIS — R531 Weakness: Secondary | ICD-10-CM

## 2013-05-15 DIAGNOSIS — G2581 Restless legs syndrome: Secondary | ICD-10-CM

## 2013-05-15 DIAGNOSIS — E119 Type 2 diabetes mellitus without complications: Secondary | ICD-10-CM

## 2013-05-15 DIAGNOSIS — T82598A Other mechanical complication of other cardiac and vascular devices and implants, initial encounter: Secondary | ICD-10-CM

## 2013-05-15 DIAGNOSIS — E039 Hypothyroidism, unspecified: Secondary | ICD-10-CM

## 2013-05-15 DIAGNOSIS — R5381 Other malaise: Secondary | ICD-10-CM

## 2013-05-15 DIAGNOSIS — R6889 Other general symptoms and signs: Secondary | ICD-10-CM

## 2013-05-15 DIAGNOSIS — E785 Hyperlipidemia, unspecified: Secondary | ICD-10-CM

## 2013-05-15 DIAGNOSIS — I10 Essential (primary) hypertension: Secondary | ICD-10-CM

## 2013-05-15 DIAGNOSIS — Z7409 Other reduced mobility: Secondary | ICD-10-CM

## 2013-05-15 LAB — POCT GLYCOSYLATED HEMOGLOBIN (HGB A1C): Hemoglobin A1C: 6.6

## 2013-05-15 NOTE — Progress Notes (Signed)
Patient ID: Sara [name], female   DOB: 08-24-1925, 79 y.o.   MRN: [id number] SUBJECTIVE: CC:  HPI: Patient is here for follow up of Diabetes Mellitus: Symptoms evaluated: Denies Nocturia ,Denies Urinary Frequency , denies Blurred vision ,deniesDizziness,denies.Dysuria,denies paresthesias, denies extremity pain or ulcers.Marland Kitchendenies chest pain. has had an annual eye exam. do check the feet. Does check CBGs. Average CBG:180 this am. But 130 to 180 Denies episodes of hypoglycemia. Does have an emergency hypoglycemic plan. admits toCompliance with medications. Denies Problems with medications. Eating a lot of protein.  Had to change the access point to the left subclavian. Working well.  Getting more difficult walking, needs a wheelchair.too weak to mobilize after dialysis and weaker these days.may fall. Fortunately hasn't fallen yet. But too weak after dialysis to ambulate with a walker that she uses.  Son is moving in with his family to help care for her.  Past Medical History  Diagnosis Date  . Hypertension 1991  . Hypothyroidism 1985  . Hyperlipidemia 1980  . Chronic anemia   . Cataracts, bilateral   . CAD (coronary artery disease) 06/2001  . Chronic renal failure   . Diverticula of colon     (L)  . Hemorrhoids   . Hematuria 03/2002  . Diabetes mellitus 1995  . Mitral valve regurgitation   . Restless leg syndrome 12/05  . PUD (peptic ulcer disease)   . Osteopenia 2007    leftt hip   . Hyperparathyroidism   . Gastric polyps   . Cirrhosis   . Obesity   . Fatty liver   . GI bleed   . CHF (congestive heart failure)   . Blood transfusion   . Gouty arthritis     Back  . Splenomegaly 2004    Thrombocytopenia  . Gout   . Heart murmur     .Followed by Dr Jacqulyn Bath  PCP  . GERD (gastroesophageal reflux disease)   . Myelodysplastic syndrome     Skin cancer on leg removed- left leg  . Macular degeneration    Past Surgical History  Procedure Laterality Date  . Total  abdominal hysterectomy  1966    fibroids   . Umbilical hernia repair  1960  . L-s bal surgery hemorrhoids  1984 & 1986  . Cholecystectomy  1970  . Appendectomy  1958  . Tonsillectomy    . Partial nephrectomy      right  . Upper gastrointestinal endoscopy  02/11/2009  . Upper gastrointestinal endoscopy  07/02/08  . Colonscopy    . Eye surgery      Cataract with Lens Bil  . Hemodialysis catheter  09/2011    Right Chest  . Cardiac catheterization      2002- Dr [place] - No blockage  . Adrenalectomy      right  . [place] thora/paracentesis  10/12/2011     removed  . Av fistula placement  12/21/2011    Procedure: ARTERIOVENOUS (AV) FISTULA CREATION;  Surgeon: Pryor [name], MD;  Location: [facility] OR;  Service: Vascular;  Laterality: Left;  Creation Left brachial cephalic arteriovenous fistula  . Kidney      rt removed  . Back surgery    . Nephrectomy      right  . Insertion of dialysis catheter N/A 04/30/2013    Procedure: INSERTION OF DIALYSIS CATHETER; ULTRASOUND GUIDED;  Surgeon: Pryor [name], MD;  Location: [facility] OR;  Service: Vascular;  Laterality: N/A;   History   Social History  . Marital Status:  Married    Spouse Name: N/A    Number of Children: N/A  . Years of Education: N/A   Occupational History  . Not on file.   Social History Main Topics  . Smoking status: Never Smoker   . Smokeless tobacco: Never Used  . Alcohol Use: No  . Drug Use: No  . Sexual Activity: Not on file   Other Topics Concern  . Not on file   Social History Narrative  . No narrative on file   Family History  Problem Relation Age of Onset  . Heart disease Father   . Diabetes Father   . Stroke Father   . Diabetes Sister   . Healthy Sister   . Healthy Son   . Healthy Son   . Healthy Daughter   . Anesthesia problems Neg Hx    Current Outpatient Prescriptions on File Prior to Visit  Medication Sig Dispense Refill  . allopurinol (ZYLOPRIM) 300 MG tablet 100 mg daily.       . carvedilol  (COREG) 25 MG tablet TAKE  (1)  TABLET TWICE A DAY.  60 tablet  3  . Cholecalciferol (VITAMIN D) 2000 UNITS CAPS Take 1 capsule by mouth every evening.       . cloNIDine (CATAPRES) 0.2 MG tablet Take 0.2 mg by mouth 2 (two) times daily.      . cyclobenzaprine (FLEXERIL) 5 MG tablet Take 1 tablet (5 mg total) by mouth at bedtime as needed for muscle spasms.  90 tablet  1  . folic acid (FOLVITE) 400 MCG tablet Take 400 mcg by mouth daily.      Marland Kitchen levothyroxine (SYNTHROID, LEVOTHROID) 175 MCG tablet Take 175 mcg by mouth daily.      . Multiple Vitamins-Minerals (CENTRUM SILVER PO) Take by mouth every morning.        . Multiple Vitamins-Minerals (PRESERVISION AREDS PO) Take 1 capsule by mouth 2 (two) times daily.       . Omega-3 Fatty Acids (FISH OIL) 1000 MG CAPS Take 1 capsule by mouth every morning.       Marland Kitchen omeprazole (PRILOSEC OTC) 20 MG tablet Take 20 mg by mouth daily.        Marland Kitchen RENVELA 800 MG tablet Take 800 mg by mouth 3 (three) times daily with meals.       . simvastatin (ZOCOR) 5 MG tablet Take 5 mg by mouth at bedtime.      . vitamin B-12 (CYANOCOBALAMIN) 1000 MCG tablet Take 1,000 mcg by mouth every morning.         No current facility-administered medications on file prior to visit.   Allergies  Allergen Reactions  . Aspirin Adult Low [Aspirin] Other (See Comments)    Ulcer  . Nsaids Nausea And Vomiting    Reaction unknown  . Sulfa Antibiotics Other (See Comments)    Rash    There is no immunization history on file for this patient. Prior to Admission medications   Medication Sig Start Date End Date Taking? Authorizing Provider  allopurinol (ZYLOPRIM) 300 MG tablet 100 mg daily.  05/06/12  Yes Historical Provider, MD  amLODipine (NORVASC) 10 MG tablet  03/30/13  Yes Historical Provider, MD  carvedilol (COREG) 25 MG tablet TAKE  (1)  TABLET TWICE A DAY. 03/16/13  Yes Ernestina Penna, MD  Cholecalciferol (VITAMIN D) 2000 UNITS CAPS Take 1 capsule by mouth every evening.    Yes  Historical Provider, MD  cloNIDine (CATAPRES) 0.2 MG tablet Take 0.2  mg by mouth 2 (two) times daily.   Yes Historical Provider, MD  cyclobenzaprine (FLEXERIL) 5 MG tablet Take 1 tablet (5 mg total) by mouth at bedtime as needed for muscle spasms. 01/15/13  Yes Ileana Ladd, MD  folic acid (FOLVITE) 400 MCG tablet Take 400 mcg by mouth daily.   Yes Historical Provider, MD  glimepiride (AMARYL) 2 MG tablet Take 2 mg by mouth daily before breakfast.   Yes Historical Provider, MD  insulin glargine (LANTUS) 100 UNIT/ML injection  04/13/13  Yes Ileana Ladd, MD  levothyroxine (SYNTHROID, LEVOTHROID) 175 MCG tablet Take 175 mcg by mouth daily.   Yes Historical Provider, MD  Multiple Vitamins-Minerals (CENTRUM SILVER PO) Take by mouth every morning.     Yes Historical Provider, MD  Multiple Vitamins-Minerals (PRESERVISION AREDS PO) Take 1 capsule by mouth 2 (two) times daily.    Yes Historical Provider, MD  Omega-3 Fatty Acids (FISH OIL) 1000 MG CAPS Take 1 capsule by mouth every morning.    Yes Historical Provider, MD  omeprazole (PRILOSEC OTC) 20 MG tablet Take 20 mg by mouth daily.     Yes Historical Provider, MD  RENVELA 800 MG tablet Take 800 mg by mouth 3 (three) times daily with meals.  03/18/12  Yes Historical Provider, MD  simvastatin (ZOCOR) 5 MG tablet Take 5 mg by mouth at bedtime.   Yes Historical Provider, MD  vitamin B-12 (CYANOCOBALAMIN) 1000 MCG tablet Take 1,000 mcg by mouth every morning.     Yes Historical Provider, MD     ROS: As above in the HPI. All other systems are stable or negative.  OBJECTIVE: APPEARANCE:  Patient in no acute distress.The patient appeared well nourished and normally developed. Acyanotic. Waist: VITAL SIGNS:BP 95/56  Pulse 68  Temp(Src) 97 F (36.1 C) (Oral)  Ht 5\' 3"  (1.6 m)  Wt 192 lb 12.8 oz (87.454 kg)  BMI 34.16 kg/m2  WF  SKIN: warm and  Dry without overt rashes, tattoos and scars  HEAD and Neck: without JVD, Head and scalp:  normal Eyes:No scleral icterus. Fundi normal, eye movements normal. Ears: Auricle normal, canal normal, Tympanic membranes normal, insufflation normal. Nose: normal Throat: normal Neck & thyroid: normal  CHEST & LUNGS: Chest wall: normal Lungs: Clear  CVS: Reveals the PMI to be normally located. Regular rhythm, First and Second Heart sounds are normal,  absence of murmurs, rubs or gallops. Peripheral vasculature: Radial pulses: normal Dorsal pedis pulses: normal Posterior pulses: normal  ABDOMEN:  Appearance: obese Benign, no organomegaly, no masses, no Abdominal Aortic enlargement. No Guarding , no rebound. No Bruits. Bowel sounds: normal  RECTAL: N/A GU: N/A  EXTREMETIES: nonedematous.  MUSCULOSKELETAL:  Spine: normal Joints: intact Frail Gailt: ambulation is unsteady and slow  NEUROLOGIC: oriented to time,place and person; nonfocal. Strength is 4 1/2 //5 generalizedd weakness. Sensory is normal: monofilament intact Reflexes are reduced. Cranial Nerves are normal. Results for orders placed in visit on 05/15/13  POCT GLYCOSYLATED HEMOGLOBIN (HGB A1C)      Result Value Range   Hemoglobin A1C 6.6%      ASSESSMENT: Cirrhosis  End stage renal disease - Plan: DME Wheelchair manual  HLD (hyperlipidemia) - Plan: NMR, lipoprofile, Hepatic function panel  Hypertension  Hypothyroidism - Plan: TSH  Mechanical complication of other vascular device, implant, and graft - Plan: DME Wheelchair manual  Restless legs syndrome  Diabetes mellitus - Plan: POCT glycosylated hemoglobin (Hb A1C)  Decreased ambulation status - Plan: DME Wheelchair manual  Weakness -  Plan: Vitamin B12, Folate   PLAN: Orders Placed This Encounter  Procedures  . DME Wheelchair manual    Patient suffers from,CAD and CHF, end stage renal disease on dialysis, anemia chronic and generalized weakness which impairs their ability to perform daily activities like ambulatin to perform ADLs in  the home, especially after dialysis.  A walker will not resolve  issue with performing activities of daily living, she has one and post dialysis unable to ambulate safely because of the weakness post dialysis.. A wheelchair will allow patient to safely perform daily activities with out falling. Patient can safely propel the wheelchair in the home or has a caregiver who can provide assistance.  Accessories: elevating leg rests (ELRs), wheel locks, extensions and anti-tippers.  . NMR, lipoprofile  . Hepatic function panel  . Vitamin B12  . Folate  . TSH  . POCT glycosylated hemoglobin (Hb A1C)    Meds ordered this encounter  Medications  . amLODipine (NORVASC) 10 MG tablet    Sig:   . glimepiride (AMARYL) 2 MG tablet    Sig: Take 2 mg by mouth daily before breakfast.  . insulin glargine (LANTUS) 100 UNIT/ML injection    Sig:     Continue present level of care.  Return in about 4 months (around 09/14/2013) for Recheck medical problems.   Ashiya Kinkead P. [place] Charon, M.D.

## 2013-05-17 LAB — NMR, LIPOPROFILE
Cholesterol: 85 mg/dL (ref <200)
HDL Cholesterol by NMR: 34 mg/dL — ABNORMAL LOW (ref >=40)
HDL Particle Number: 28.6 umol/L — ABNORMAL LOW (ref >=30.5)
LDL Particle Number: <300 nmol/L (ref <1000)
LDL Size: 20.4 nm — ABNORMAL LOW (ref >20.5)
LDLC SERPL CALC-MCNC: 22 mg/dL (ref <100)
LP-IR Score: 55 — ABNORMAL HIGH (ref <=45)
Small LDL Particle Number: 154 nmol/L (ref <=527)
Triglycerides by NMR: 143 mg/dL (ref <150)

## 2013-05-17 LAB — HEPATIC FUNCTION PANEL
ALT: 13 IU/L (ref 0–32)
AST: 13 IU/L (ref 0–40)
Albumin: 4 g/dL (ref 3.5–4.7)
Alkaline Phosphatase: 147 IU/L — ABNORMAL HIGH (ref 39–117)
Bilirubin, Direct: 0.14 mg/dL (ref 0.00–0.40)
Total Bilirubin: 0.3 mg/dL (ref 0.0–1.2)
Total Protein: 6.3 g/dL (ref 6.0–8.5)

## 2013-05-17 LAB — VITAMIN B12: Vitamin B-12: >1999 pg/mL — ABNORMAL HIGH (ref 211–946)

## 2013-05-17 LAB — TSH: TSH: 1.19 u[IU]/mL (ref 0.450–4.500)

## 2013-05-17 LAB — FOLATE: Folate: >19.9 ng/mL (ref >3.0)

## 2013-05-17 NOTE — Progress Notes (Signed)
Quick Note:  Labs abnormal. The LDLc is too low!! Please d/c the somvastatin. Stay on the omega-3 to control the triglycerides. We will recheck the lipids at the next visit. ______

## 2013-05-26 ENCOUNTER — Other Ambulatory Visit: Payer: Self-pay | Admitting: Family Medicine

## 2013-06-02 ENCOUNTER — Encounter: Payer: Self-pay | Admitting: Vascular Surgery

## 2013-06-03 ENCOUNTER — Encounter: Payer: Self-pay | Admitting: Vascular Surgery

## 2013-06-03 ENCOUNTER — Other Ambulatory Visit: Payer: Self-pay | Admitting: Family Medicine

## 2013-06-03 ENCOUNTER — Encounter: Payer: Self-pay | Admitting: *Deleted

## 2013-06-03 ENCOUNTER — Ambulatory Visit (INDEPENDENT_AMBULATORY_CARE_PROVIDER_SITE_OTHER): Payer: Self-pay | Admitting: Vascular Surgery

## 2013-06-03 ENCOUNTER — Other Ambulatory Visit: Payer: Self-pay | Admitting: *Deleted

## 2013-06-03 VITALS — BP 98/41 | HR 65 | Ht 63.0 in | Wt 194.7 lb

## 2013-06-03 DIAGNOSIS — N186 End stage renal disease: Secondary | ICD-10-CM

## 2013-06-03 NOTE — Progress Notes (Signed)
Patient name: Sara [name] MRN: [id number] DOB: 25-Dec-1924 Sex: female  REASON FOR VISIT: follow up of infiltrative left upper arm AV fistula.  HPI: Sara [name] is a 79 y.o. female who I had seen in the office on 04/29/2013. She has a left brachiocephalic AV fistula. The fistula had been working well. However at one of her dialysis session she developed a large infiltrate in her left upper arm making the fistula difficult to cannulate. I recommended placement of the catheter which was done on 04/30/2012. Comes in to follow up the infiltrative her left upper arm.  She states that the pain in her left arm has improved significantly. They have been using her catheter on Tuesdays Thursdays and Saturdays.  REVIEW OF SYSTEMS: Arly.Keller ] denotes positive finding; [  ] denotes negative finding  CARDIOVASCULAR:  [ ]  chest pain   [ ]  dyspnea on exertion    CONSTITUTIONAL:  [ ]  fever   [ ]  chills  PHYSICAL EXAM: Filed Vitals:   06/03/13 1111  BP: 98/41  Pulse: 65  Height: 5\' 3"  (1.6 m)  Weight: 194 lb 11.2 oz (88.315 kg)  SpO2: 100%   Body mass index is 34.5 kg/(m^2). GENERAL: The patient is a well-nourished female, in no acute distress. The vital signs are documented above. CARDIOVASCULAR: There is a regular rate and rhythm  PULMONARY: There is good air exchange bilaterally without wheezing or rales. Her fistula has a good thrill proximally but it is more pulsatile distally. There is still some ecchymosis although the swelling has improved significantly.  MEDICAL ISSUES: Before allowing dialysis to cannulate her fistula again, I have recommended that we proceed with a fistulogram. This has been scheduled for 06/22/2013.  DICKSON,CHRISTOPHER S [facility] [place] Beeper: [phone]

## 2013-06-18 ENCOUNTER — Encounter (HOSPITAL_COMMUNITY): Payer: Self-pay | Admitting: Pharmacy Technician

## 2013-06-21 MED ORDER — SODIUM CHLORIDE 0.9 % IJ SOLN: 3.0000 mL | INTRAMUSCULAR | Status: DC | PRN

## 2013-06-22 ENCOUNTER — Encounter (HOSPITAL_COMMUNITY)
Admission: RE | Disposition: A | Discharge status: Home / Self Care | Payer: Self-pay | Source: Ambulatory Visit | Attending: Vascular Surgery

## 2013-06-22 ENCOUNTER — Ambulatory Visit (HOSPITAL_COMMUNITY)
Admission: RE | Admit: 2013-06-22 | Discharge: 2013-06-22 | Disposition: A | Discharge status: Home / Self Care | Payer: Medicare Other | Source: Ambulatory Visit | Attending: Vascular Surgery | Admitting: Vascular Surgery

## 2013-06-22 DIAGNOSIS — Y832 Surgical operation with anastomosis, bypass or graft as the cause of abnormal reaction of the patient, or of later complication, without mention of misadventure at the time of the procedure: Secondary | ICD-10-CM | POA: Insufficient documentation

## 2013-06-22 DIAGNOSIS — I871 Compression of vein: Secondary | ICD-10-CM | POA: Insufficient documentation

## 2013-06-22 DIAGNOSIS — T82898A Other specified complication of vascular prosthetic devices, implants and grafts, initial encounter: Secondary | ICD-10-CM

## 2013-06-22 HISTORY — PX: FISTULOGRAM: SHX5832

## 2013-06-22 LAB — GLUCOSE, CAPILLARY

## 2013-06-22 LAB — POCT I-STAT, CHEM 8
BUN: 91 mg/dL — ABNORMAL HIGH (ref 6–23)
Calcium, Ion: 0.99 mmol/L — ABNORMAL LOW (ref 1.13–1.30)
Chloride: 97 mEq/L (ref 96–112)
Creatinine, Ser: 8.6 mg/dL — ABNORMAL HIGH (ref 0.50–1.10)
Glucose, Bld: 201 mg/dL — ABNORMAL HIGH (ref 70–99)
HCT: 33 % — ABNORMAL LOW (ref 36.0–46.0)
Sodium: 133 mEq/L — ABNORMAL LOW (ref 135–145)

## 2013-06-22 SURGERY — FISTULOGRAM
Anesthesia: LOCAL | Laterality: Left

## 2013-06-22 NOTE — Op Note (Signed)
PATIENT: Sara [name]  MRN: [id number] DOB: 03-04-1925    DATE OF PROCEDURE: 06/22/2013  INDICATIONS: Sara [name] is a 79 y.o. female who had a left brachiocephalic AV fistula in 2013. She had a patch angioplasty of her proximal fistula and also ligation of competing branch. I saw her in September of this year after she had a large infiltrate. Therefore a catheter was placed and they have not been using her fistula. Her fistula somewhat pulsatile I recommended proceeding with a fistulogram prior to allowing cannulation of her fistula.  PROCEDURE:  1. Ultrasound-guided access to left brachiocephalic AV fistula 2. fistulogram left brachiocephalic AV fistula 3. venoplasty of left cephalic vein where it enters the subclavian vein  SURGEON: Di Kindle. Edilia Bo, MD, FACS  ANESTHESIA: local   EBL: minimal  TECHNIQUE: The patient was taken to the peripheral vascular lab. The left upper extremity was prepped and draped in usual sterile fashion. Under ultrasound guidance, after the skin was anesthetized, the proximal fistula was cannulated with a micropuncture needle and a wire introduced under fluoroscopic guidance. The micropuncture sheath was introduced over the wire. Fistulogram was obtained examining the fistula from the cannulation sites of the central veins. Pressure was then held over the fistula to allow reflux into the proximal fistula and allow visualization of the anastomosis. The cephalic vein centrally where it entered the subclavian vein became bifid with a small inferior branch and a larger superior branch. There appeared to be some irregularity in a larger branch at the site of the valve and just beyond this. I elected to proceed with venoplasty. The micropuncture sheath was exchanged to a short 5 [place] sheath over a [employer] wire. The patient received 2000 units of IV heparin. A 6 mm x 6 cm Mustang balloon was selected and positioned across the stenosis. This was inflated to 13  atmospheres for 1 minute. Completion film showed some mild residual stenosis. I therefore went back to 16 atmospheres be a diameter of 6.2 mm or 1 minute. Completion film showed an excellent result. The cannulation site was closed with a 40 Markle suture. There is good hemostasis noted.  FINDINGS:  1. Patent left brachiocephalic AV fistula. 2. 1 competing branch in the midportion of the fistula which does not appear to be at significant at this point. 3. Irregularity of the outflow cephalic vein where the vein was bifid which was successfully ballooned as described above.  CLINICAL NOTE: The fistula can be used in one week.  Waverly Ferrari, MD, FACS [facility]  DATE OF DICTATION:   06/22/2013

## 2013-06-26 ENCOUNTER — Telehealth: Payer: Self-pay | Admitting: Family Medicine

## 2013-06-29 NOTE — Telephone Encounter (Signed)
Legs cramp and spasm in the evenings. On Flexeril and it is not helping.  Last month the Calcium was low and they were planning to check it this Thursday. Advised that it needs to be rechecked including the Magnesium. She will let them know at dialysis. She will try some yellow mustard in the evening in the meantime.  Carolee Channell P. [place] Charon, M.D.

## 2013-07-14 ENCOUNTER — Other Ambulatory Visit: Payer: Self-pay | Admitting: *Deleted

## 2013-07-14 MED ORDER — AMLODIPINE BESYLATE 10 MG PO TABS: 10.0000 mg | ORAL_TABLET | Freq: Every day | ORAL | Status: DC

## 2013-09-18 ENCOUNTER — Ambulatory Visit (INDEPENDENT_AMBULATORY_CARE_PROVIDER_SITE_OTHER): Payer: Medicare Other | Admitting: Family Medicine

## 2013-09-18 ENCOUNTER — Encounter: Payer: Self-pay | Admitting: Family Medicine

## 2013-09-18 VITALS — BP 107/55 | HR 95 | Temp 97.1°F | Ht 64.0 in | Wt 193.4 lb

## 2013-09-18 DIAGNOSIS — I10 Essential (primary) hypertension: Secondary | ICD-10-CM

## 2013-09-18 DIAGNOSIS — N186 End stage renal disease: Secondary | ICD-10-CM

## 2013-09-18 DIAGNOSIS — G2581 Restless legs syndrome: Secondary | ICD-10-CM

## 2013-09-18 DIAGNOSIS — E785 Hyperlipidemia, unspecified: Secondary | ICD-10-CM

## 2013-09-18 DIAGNOSIS — E119 Type 2 diabetes mellitus without complications: Secondary | ICD-10-CM

## 2013-09-18 DIAGNOSIS — T82598A Other mechanical complication of other cardiac and vascular devices and implants, initial encounter: Secondary | ICD-10-CM

## 2013-09-18 DIAGNOSIS — K7460 Unspecified cirrhosis of liver: Secondary | ICD-10-CM

## 2013-09-18 DIAGNOSIS — E039 Hypothyroidism, unspecified: Secondary | ICD-10-CM

## 2013-09-18 LAB — POCT GLYCOSYLATED HEMOGLOBIN (HGB A1C): Hemoglobin A1C: 7.7

## 2013-09-18 NOTE — Progress Notes (Signed)
Patient ID: Sara [name], female   DOB: [birth year], 80 y.o.   MRN: [id number] SUBJECTIVE: CC: Chief Complaint  Patient presents with  . Follow-up    4 month follow up states she will be going to [facility] in [place].      HPI: 09/08/2013 was at [facility]. SOB and had bronchitis. Treated with antibiotics. On nebulizer. 3 to 4 x a  Day.  Patient is here for follow up of Diabetes Mellitus/HTN/HLD: Symptoms evaluated: Denies Nocturia ,Denies Urinary Frequency , denies Blurred vision ,deniesDizziness,denies.Dysuria,denies paresthesias, denies extremity pain or ulcers.Marland Kitchendenies chest pain. has had an annual eye exam. do check the feet. Does check CBGs. Average CBG:88 Denies episodes of hypoglycemia. Does have an emergency hypoglycemic plan. admits toCompliance with medications. Denies Problems with medications.   ESRD on Dialysis doing well. Will be moving to independent living soon.  Past Medical History  Diagnosis Date  . Hypertension 1991  . Hypothyroidism 1985  . Hyperlipidemia 1980  . Chronic anemia   . Cataracts, bilateral   . CAD (coronary artery disease) 06/2001  . Chronic renal failure   . Diverticula of colon     (L)  . Hemorrhoids   . Hematuria 03/2002  . Diabetes mellitus 1995  . Mitral valve regurgitation   . Restless leg syndrome 12/05  . PUD (peptic ulcer disease)   . Osteopenia 2007    leftt hip   . Hyperparathyroidism   . Gastric polyps   . Cirrhosis   . Obesity   . Fatty liver   . GI bleed   . CHF (congestive heart failure)   . Blood transfusion   . Gouty arthritis     Back  . Splenomegaly 2004    Thrombocytopenia  . Gout   . Heart murmur     .Followed by Dr Laverta Baltimore  PCP  . GERD (gastroesophageal reflux disease)   . Myelodysplastic syndrome     Skin cancer on leg removed- left leg  . Macular degeneration    Past Surgical History  Procedure Laterality Date  . Total abdominal hysterectomy  1966    fibroids   . Umbilical  hernia repair  1960  . L-s bal surgery hemorrhoids  1984 & 1986  . Cholecystectomy  1970  . Appendectomy  1958  . Tonsillectomy    . Partial nephrectomy      right  . Upper gastrointestinal endoscopy  02/11/2009  . Upper gastrointestinal endoscopy  07/02/08  . Colonscopy    . Eye surgery      Cataract with Lens Bil  . Hemodialysis catheter  09/2011    Right Chest  . Cardiac catheterization      2002- Dr Martinique - No blockage  . Adrenalectomy      right  . [place] thora/paracentesis  10/12/2011    1840ml  removed  . Av fistula placement  12/21/2011    Procedure: ARTERIOVENOUS (AV) FISTULA CREATION;  Surgeon: Mal Misty, MD;  Location: [facility] OR;  Service: Vascular;  Laterality: Left;  Creation Left brachial cephalic arteriovenous fistula  . Kidney      rt removed  . Back surgery    . Nephrectomy      right  . Insertion of dialysis catheter N/A 04/30/2013    Procedure: INSERTION OF DIALYSIS CATHETER; ULTRASOUND GUIDED;  Surgeon: Mal Misty, MD;  Location: [place];  Service: Vascular;  Laterality: N/A;   History   Social History  . Marital Status: Married    Spouse Name:  N/A    Number of Children: N/A  . Years of Education: N/A   Occupational History  . Not on file.   Social History Main Topics  . Smoking status: Never Smoker   . Smokeless tobacco: Never Used  . Alcohol Use: No  . Drug Use: No  . Sexual Activity: Not on file   Other Topics Concern  . Not on file   Social History Narrative  . No narrative on file   Family History  Problem Relation Age of Onset  . Heart disease Father   . Diabetes Father   . Stroke Father   . Diabetes Sister   . Healthy Sister   . Healthy Son   . Healthy Son   . Healthy Daughter   . Anesthesia problems Neg Hx    Current Outpatient Prescriptions on File Prior to Visit  Medication Sig Dispense Refill  . allopurinol (ZYLOPRIM) 100 MG tablet Take 100 mg by mouth daily.      Marland Kitchen amLODipine (NORVASC) 10 MG tablet Take 1 tablet (10 mg  total) by mouth daily.  90 tablet  0  . carvedilol (COREG) 25 MG tablet Take 12.5 mg by mouth 2 (two) times daily with a meal.      . Cholecalciferol (VITAMIN D) 2000 UNITS CAPS Take 2,000 Units by mouth every evening.       . cloNIDine (CATAPRES) 0.2 MG tablet Take 0.2 mg by mouth 2 (two) times daily.      . cyclobenzaprine (FLEXERIL) 5 MG tablet Take 1 tablet (5 mg total) by mouth at bedtime as needed for muscle spasms.  90 tablet  1  . folic acid (FOLVITE) A999333 MCG tablet Take 400 mcg by mouth daily.      Marland Kitchen glimepiride (AMARYL) 2 MG tablet Take 2 mg by mouth daily before breakfast.      . insulin glargine (LANTUS) 100 UNIT/ML injection Inject 20 Units into the skin at bedtime.       Marland Kitchen levothyroxine (SYNTHROID, LEVOTHROID) 175 MCG tablet Take 175 mcg by mouth daily.      . Multiple Vitamins-Minerals (PRESERVISION AREDS PO) Take 1 capsule by mouth 2 (two) times daily.       . Omega-3 Fatty Acids (FISH OIL) 1000 MG CAPS Take 1,000 mg by mouth every morning.       Marland Kitchen omeprazole (PRILOSEC OTC) 20 MG tablet Take 20 mg by mouth daily.        Marland Kitchen RENVELA 800 MG tablet Take 800 mg by mouth 3 (three) times daily with meals.       . vitamin B-12 (CYANOCOBALAMIN) 1000 MCG tablet Take 1,000 mcg by mouth every morning.         No current facility-administered medications on file prior to visit.   Allergies  Allergen Reactions  . Aspirin Adult Low [Aspirin] Other (See Comments)    Ulcer  . Nsaids Nausea And Vomiting    Reaction unknown  . Sulfa Antibiotics Other (See Comments)    Rash   Immunization History  Administered Date(s) Administered  . Influenza-Unspecified 04/27/2013  . Pneumococcal-Unspecified 08/27/2000   Prior to Admission medications   Medication Sig Start Date End Date Taking? Authorizing Provider  allopurinol (ZYLOPRIM) 100 MG tablet Take 100 mg by mouth daily.   Yes Historical Provider, MD  amLODipine (NORVASC) 10 MG tablet Take 1 tablet (10 mg total) by mouth daily. 07/14/13  Yes  Vernie Shanks, MD  carvedilol (COREG) 25 MG tablet Take 12.5 mg by  mouth 2 (two) times daily with a meal.   Yes Historical Provider, MD  Cholecalciferol (VITAMIN D) 2000 UNITS CAPS Take 2,000 Units by mouth every evening.    Yes Historical Provider, MD  cloNIDine (CATAPRES) 0.2 MG tablet Take 0.2 mg by mouth 2 (two) times daily.   Yes Historical Provider, MD  cyclobenzaprine (FLEXERIL) 5 MG tablet Take 1 tablet (5 mg total) by mouth at bedtime as needed for muscle spasms. 01/15/13  Yes Vernie Shanks, MD  folic acid (FOLVITE) 109 MCG tablet Take 400 mcg by mouth daily.   Yes Historical Provider, MD  glimepiride (AMARYL) 2 MG tablet Take 2 mg by mouth daily before breakfast.   Yes Historical Provider, MD  insulin glargine (LANTUS) 100 UNIT/ML injection Inject 20 Units into the skin at bedtime.  04/13/13  Yes Vernie Shanks, MD  levothyroxine (SYNTHROID, LEVOTHROID) 175 MCG tablet Take 175 mcg by mouth daily.   Yes Historical Provider, MD  Multiple Vitamins-Minerals (PRESERVISION AREDS PO) Take 1 capsule by mouth 2 (two) times daily.    Yes Historical Provider, MD  Omega-3 Fatty Acids (FISH OIL) 1000 MG CAPS Take 1,000 mg by mouth every morning.    Yes Historical Provider, MD  omeprazole (PRILOSEC OTC) 20 MG tablet Take 20 mg by mouth daily.     Yes Historical Provider, MD  RENVELA 800 MG tablet Take 800 mg by mouth 3 (three) times daily with meals.  03/18/12  Yes Historical Provider, MD  vitamin B-12 (CYANOCOBALAMIN) 1000 MCG tablet Take 1,000 mcg by mouth every morning.     Yes Historical Provider, MD     ROS: As above in the HPI. All other systems are stable or negative.  OBJECTIVE: APPEARANCE:  Patient in no acute distress.The patient appeared well nourished and normally developed. Acyanotic. Waist: VITAL SIGNS:BP 107/55  Pulse 95  Temp(Src) 97.1 F (36.2 C) (Oral)  Ht 5\' 4"  (1.626 m)  Wt 193 lb 6.4 oz (87.726 kg)  BMI 33.18 kg/m2  Elderly WF. Ambulates with a walker SKIN: warm and   Dry without overt rashes, tattoos and scars  HEAD and Neck: without JVD, Head and scalp: normal Eyes:No scleral icterus. Fundi normal, eye movements normal. Ears: Auricle normal, canal normal, Tympanic membranes normal, insufflation normal. Nose: normal Throat: normal Neck & thyroid: normal  CHEST & LUNGS: Chest wall: normal Lungs: Clear  CVS: Reveals the PMI to be normally located. Regular rhythm, First and Second Heart sounds are normal,  absence of murmurs, rubs or gallops. Peripheral vasculature: Shunt / access for dialysis.  ABDOMEN:  Appearance: normal Benign, no organomegaly, no masses, no Abdominal Aortic enlargement. No Guarding , no rebound. No Bruits. Bowel sounds: normal  RECTAL: N/A GU: N/A  EXTREMETIES: trace edematous. Has shunt in place.   NEUROLOGIC: oriented to time,place and person; nonfocal.   ASSESSMENT:  Diabetes mellitus - Plan: POCT glycosylated hemoglobin (Hb A1C)  Hypertension - Plan: Hepatic function panel  HLD (hyperlipidemia) - Plan: Hepatic function panel, NMR, lipoprofile  Restless legs syndrome  Mechanical complication of other vascular device, implant, and graft  Hypothyroidism - Plan: TSH  End stage renal disease - Plan: Hepatic function panel  Cirrhosis  PLAN: Agree with independent living placement.  Continue same level of care with medications and dialysis Patient is aware of her long term prognosis and end of life issues. Her legal documents are all in place. Orders Placed This Encounter  Procedures  . Hepatic function panel  . NMR, lipoprofile  . TSH  .  POCT glycosylated hemoglobin (Hb A1C)  daily foot checks.  Meds ordered this encounter  Medications  . ipratropium-albuterol (DUONEB) 0.5-2.5 (3) MG/3ML SOLN    Sig: Take 3 mLs by nebulization 4 (four) times daily.   There are no discontinued medications. Return in about 3 months (around 12/17/2013) for Recheck medical problems. if not in a  facility.  Sara Persad P. Jacelyn [name], M.D.

## 2013-09-20 LAB — NMR, LIPOPROFILE
Cholesterol: 83 mg/dL (ref <200)
HDL Cholesterol by NMR: 24 mg/dL — ABNORMAL LOW (ref >=40)
HDL Particle Number: 19.3 umol/L — ABNORMAL LOW (ref >=30.5)
LDL Particle Number: 520 nmol/L (ref <1000)
LDL Size: 21 nm (ref >20.5)
LDLC SERPL CALC-MCNC: 24 mg/dL (ref <100)
LP-IR Score: 38 (ref <=45)
Small LDL Particle Number: 402 nmol/L (ref <=527)
Triglycerides by NMR: 176 mg/dL — ABNORMAL HIGH (ref <150)

## 2013-09-20 LAB — HEPATIC FUNCTION PANEL
ALT: 15 IU/L (ref 0–32)
AST: 18 IU/L (ref 0–40)
Albumin: 3.4 g/dL — ABNORMAL LOW (ref 3.5–4.7)
Alkaline Phosphatase: 165 IU/L — ABNORMAL HIGH (ref 39–117)
Bilirubin, Direct: 0.15 mg/dL (ref 0.00–0.40)
Total Bilirubin: 0.4 mg/dL (ref 0.0–1.2)
Total Protein: 5.8 g/dL — ABNORMAL LOW (ref 6.0–8.5)

## 2013-09-20 LAB — TSH: TSH: 3.73 u[IU]/mL (ref 0.450–4.500)

## 2013-09-20 NOTE — Progress Notes (Signed)
Quick Note:  Call Patient Labs that are abnormal: HGBA1C went up. This may be due to holidays eating.  The rest are stable  Recommendations: Get back on diet. No change in plans or medications.   ______

## 2013-09-22 ENCOUNTER — Telehealth: Payer: Self-pay | Admitting: Family Medicine

## 2013-09-22 NOTE — Telephone Encounter (Signed)
Spoke with pt and lab discussed

## 2013-09-25 ENCOUNTER — Ambulatory Visit (INDEPENDENT_AMBULATORY_CARE_PROVIDER_SITE_OTHER): Payer: Medicare Other

## 2013-09-25 DIAGNOSIS — Z111 Encounter for screening for respiratory tuberculosis: Secondary | ICD-10-CM

## 2013-09-28 LAB — TB SKIN TEST
Induration: 0 mm
TB Skin Test: NEGATIVE

## 2013-10-05 ENCOUNTER — Other Ambulatory Visit: Payer: Self-pay | Admitting: Family Medicine

## 2013-10-08 ENCOUNTER — Other Ambulatory Visit: Payer: Self-pay | Admitting: Family Medicine

## 2013-10-09 NOTE — Telephone Encounter (Signed)
Call patient : Prescription refilled & sent to pharmacy in [place]. 

## 2013-10-12 ENCOUNTER — Other Ambulatory Visit: Payer: Self-pay

## 2013-10-12 MED ORDER — CARVEDILOL 25 MG PO TABS: 12.5000 mg | ORAL_TABLET | Freq: Two times a day (BID) | ORAL | Status: AC

## 2013-10-12 MED ORDER — GLIMEPIRIDE 2 MG PO TABS: 2.0000 mg | ORAL_TABLET | Freq: Every day | ORAL | Status: DC

## 2013-11-23 ENCOUNTER — Other Ambulatory Visit: Payer: Self-pay | Admitting: Family Medicine

## 2013-11-24 NOTE — Telephone Encounter (Signed)
Last ov 09/18/13

## 2013-11-24 NOTE — Telephone Encounter (Signed)
Call patient : Prescription refilled & sent to pharmacy in [place]. 

## 2013-12-23 ENCOUNTER — Other Ambulatory Visit: Payer: Self-pay | Admitting: Family Medicine

## 2014-01-22 ENCOUNTER — Other Ambulatory Visit: Payer: Self-pay

## 2014-01-22 MED ORDER — GLIMEPIRIDE 2 MG PO TABS: ORAL_TABLET | ORAL | Status: DC

## 2014-01-22 MED ORDER — AMLODIPINE BESYLATE 10 MG PO TABS: ORAL_TABLET | ORAL | Status: AC

## 2014-01-22 MED ORDER — ALLOPURINOL 300 MG PO TABS: ORAL_TABLET | ORAL | Status: DC

## 2014-01-22 NOTE — Telephone Encounter (Signed)
These prescriptions may be refilled but this patient needs to have an appointment to be seen to followup on her medical conditions.

## 2014-01-22 NOTE — Telephone Encounter (Signed)
Last seen 09/18/13 FPW 

## 2014-02-17 ENCOUNTER — Other Ambulatory Visit: Payer: Self-pay | Admitting: Family Medicine

## 2014-02-24 ENCOUNTER — Other Ambulatory Visit: Payer: Self-pay | Admitting: *Deleted

## 2014-02-24 DIAGNOSIS — G2581 Restless legs syndrome: Secondary | ICD-10-CM

## 2014-02-24 MED ORDER — CYCLOBENZAPRINE HCL 5 MG PO TABS: 5.0000 mg | ORAL_TABLET | Freq: Every evening | ORAL | Status: DC | PRN

## 2014-02-24 NOTE — Telephone Encounter (Signed)
Patient of Dr Jacelyn Grip. Last seen in office on 09-18-13. Please advise on refill

## 2014-03-19 ENCOUNTER — Other Ambulatory Visit: Payer: Self-pay | Admitting: Family

## 2014-04-19 ENCOUNTER — Other Ambulatory Visit: Payer: Self-pay | Admitting: Family

## 2014-04-19 ENCOUNTER — Other Ambulatory Visit: Payer: Self-pay | Admitting: Family Medicine

## 2014-04-21 ENCOUNTER — Other Ambulatory Visit: Payer: Self-pay | Admitting: *Deleted

## 2014-04-21 MED ORDER — INSULIN GLARGINE 100 UNIT/ML ~~LOC~~ SOLN: 20.0000 [IU] | Freq: Every day | SUBCUTANEOUS | Status: DC

## 2014-05-18 ENCOUNTER — Other Ambulatory Visit: Payer: Self-pay | Admitting: Family

## 2014-05-18 ENCOUNTER — Other Ambulatory Visit: Payer: Self-pay | Admitting: Pharmacist

## 2014-05-19 NOTE — Telephone Encounter (Signed)
no more refills without being seen  

## 2014-05-19 NOTE — Telephone Encounter (Signed)
Last seen 09/18/13  FPW   Last glucose 1/15

## 2014-05-19 NOTE — Telephone Encounter (Signed)
Last seen and last glucose 09/18/13  FPW

## 2014-06-17 ENCOUNTER — Other Ambulatory Visit: Payer: Self-pay | Admitting: Family Medicine

## 2014-06-17 ENCOUNTER — Other Ambulatory Visit: Payer: Self-pay | Admitting: Nurse Practitioner

## 2014-06-18 NOTE — Telephone Encounter (Signed)
Deny refill- ntbs

## 2014-06-18 NOTE — Telephone Encounter (Signed)
Last seen and last glucose 09/18/13 FPW

## 2014-06-18 NOTE — Telephone Encounter (Signed)
Last seen 09/18/13 FPW

## 2014-06-18 NOTE — Telephone Encounter (Signed)
no more refills without being seen  

## 2014-07-19 ENCOUNTER — Other Ambulatory Visit: Payer: Self-pay | Admitting: Nurse Practitioner

## 2014-07-19 ENCOUNTER — Other Ambulatory Visit: Payer: Self-pay | Admitting: Family Medicine

## 2014-07-20 NOTE — Telephone Encounter (Signed)
Refill this time 1 please give patient an appointment to be seen by a provider

## 2014-07-20 NOTE — Telephone Encounter (Signed)
Last ov 1/15. 

## 2014-07-20 NOTE — Telephone Encounter (Signed)
This may be refilled 1 and patient needs an appointment to be seen

## 2014-07-20 NOTE — Telephone Encounter (Signed)
Last AIC 1/15- 7.7. ntbs

## 2014-08-02 ENCOUNTER — Other Ambulatory Visit: Payer: Self-pay | Admitting: *Deleted

## 2014-08-02 MED ORDER — LEVOTHYROXINE SODIUM 175 MCG PO TABS
175.0000 ug | ORAL_TABLET | Freq: Every day | ORAL | Status: AC
Start: 2014-08-02 — End: ?

## 2014-08-02 NOTE — Telephone Encounter (Signed)
Last thyroid 1/15. ntbs for further refills.

## 2014-08-02 NOTE — Telephone Encounter (Signed)
Please okayed this prescription 1 and make sure the patient comes in for a thyroid profile

## 2014-08-05 ENCOUNTER — Encounter (HOSPITAL_COMMUNITY): Payer: Self-pay | Admitting: Vascular Surgery

## 2014-08-16 ENCOUNTER — Other Ambulatory Visit: Payer: Self-pay | Admitting: Family Medicine

## 2014-08-16 ENCOUNTER — Other Ambulatory Visit: Payer: Self-pay | Admitting: Nurse Practitioner

## 2014-08-16 NOTE — Telephone Encounter (Signed)
Last seen 09/18/13  Dr Jacelyn Grip

## 2014-08-16 NOTE — Telephone Encounter (Signed)
Last seen 09/18/13 Dr Jacelyn Grip

## 2014-08-18 ENCOUNTER — Other Ambulatory Visit: Payer: Self-pay | Admitting: Nurse Practitioner

## 2014-08-27 ENCOUNTER — Encounter (HOSPITAL_COMMUNITY): Payer: Self-pay | Admitting: Internal Medicine

## 2014-08-27 ENCOUNTER — Inpatient Hospital Stay (HOSPITAL_COMMUNITY)
Admission: AD | Admit: 2014-08-27 | Discharge: 2014-09-27 | DRG: 356 | Disposition: E | Payer: Medicare Other | Source: Other Acute Inpatient Hospital | Attending: Pulmonary Disease | Admitting: Pulmonary Disease

## 2014-08-27 DIAGNOSIS — Z90710 Acquired absence of both cervix and uterus: Secondary | ICD-10-CM

## 2014-08-27 DIAGNOSIS — J9811 Atelectasis: Secondary | ICD-10-CM | POA: Diagnosis not present

## 2014-08-27 DIAGNOSIS — Z79899 Other long term (current) drug therapy: Secondary | ICD-10-CM

## 2014-08-27 DIAGNOSIS — E8809 Other disorders of plasma-protein metabolism, not elsewhere classified: Secondary | ICD-10-CM | POA: Diagnosis present

## 2014-08-27 DIAGNOSIS — I959 Hypotension, unspecified: Secondary | ICD-10-CM | POA: Diagnosis present

## 2014-08-27 DIAGNOSIS — K273 Acute peptic ulcer, site unspecified, without hemorrhage or perforation: Secondary | ICD-10-CM | POA: Diagnosis present

## 2014-08-27 DIAGNOSIS — I120 Hypertensive chronic kidney disease with stage 5 chronic kidney disease or end stage renal disease: Secondary | ICD-10-CM | POA: Diagnosis present

## 2014-08-27 DIAGNOSIS — N2581 Secondary hyperparathyroidism of renal origin: Secondary | ICD-10-CM | POA: Diagnosis present

## 2014-08-27 DIAGNOSIS — E669 Obesity, unspecified: Secondary | ICD-10-CM | POA: Diagnosis present

## 2014-08-27 DIAGNOSIS — Z6835 Body mass index (BMI) 35.0-35.9, adult: Secondary | ICD-10-CM | POA: Diagnosis not present

## 2014-08-27 DIAGNOSIS — N186 End stage renal disease: Secondary | ICD-10-CM | POA: Diagnosis present

## 2014-08-27 DIAGNOSIS — E119 Type 2 diabetes mellitus without complications: Secondary | ICD-10-CM | POA: Diagnosis present

## 2014-08-27 DIAGNOSIS — J90 Pleural effusion, not elsewhere classified: Secondary | ICD-10-CM | POA: Diagnosis not present

## 2014-08-27 DIAGNOSIS — Z882 Allergy status to sulfonamides status: Secondary | ICD-10-CM

## 2014-08-27 DIAGNOSIS — K3189 Other diseases of stomach and duodenum: Secondary | ICD-10-CM | POA: Diagnosis present

## 2014-08-27 DIAGNOSIS — Z886 Allergy status to analgesic agent status: Secondary | ICD-10-CM | POA: Diagnosis not present

## 2014-08-27 DIAGNOSIS — D469 Myelodysplastic syndrome, unspecified: Secondary | ICD-10-CM | POA: Diagnosis present

## 2014-08-27 DIAGNOSIS — J9809 Other diseases of bronchus, not elsewhere classified: Secondary | ICD-10-CM | POA: Insufficient documentation

## 2014-08-27 DIAGNOSIS — T17500A Unspecified foreign body in bronchus causing asphyxiation, initial encounter: Secondary | ICD-10-CM | POA: Insufficient documentation

## 2014-08-27 DIAGNOSIS — K766 Portal hypertension: Secondary | ICD-10-CM | POA: Diagnosis present

## 2014-08-27 DIAGNOSIS — Z9889 Other specified postprocedural states: Secondary | ICD-10-CM

## 2014-08-27 DIAGNOSIS — M109 Gout, unspecified: Secondary | ICD-10-CM | POA: Diagnosis present

## 2014-08-27 DIAGNOSIS — G2581 Restless legs syndrome: Secondary | ICD-10-CM | POA: Diagnosis present

## 2014-08-27 DIAGNOSIS — T17990A Other foreign object in respiratory tract, part unspecified in causing asphyxiation, initial encounter: Secondary | ICD-10-CM | POA: Diagnosis present

## 2014-08-27 DIAGNOSIS — K7581 Nonalcoholic steatohepatitis (NASH): Secondary | ICD-10-CM | POA: Diagnosis present

## 2014-08-27 DIAGNOSIS — M8580 Other specified disorders of bone density and structure, unspecified site: Secondary | ICD-10-CM | POA: Diagnosis present

## 2014-08-27 DIAGNOSIS — Z992 Dependence on renal dialysis: Secondary | ICD-10-CM

## 2014-08-27 DIAGNOSIS — Z794 Long term (current) use of insulin: Secondary | ICD-10-CM

## 2014-08-27 DIAGNOSIS — D62 Acute posthemorrhagic anemia: Secondary | ICD-10-CM | POA: Diagnosis present

## 2014-08-27 DIAGNOSIS — K922 Gastrointestinal hemorrhage, unspecified: Secondary | ICD-10-CM | POA: Diagnosis present

## 2014-08-27 DIAGNOSIS — I8501 Esophageal varices with bleeding: Secondary | ICD-10-CM | POA: Diagnosis present

## 2014-08-27 DIAGNOSIS — D638 Anemia in other chronic diseases classified elsewhere: Secondary | ICD-10-CM | POA: Diagnosis present

## 2014-08-27 DIAGNOSIS — Z66 Do not resuscitate: Secondary | ICD-10-CM | POA: Diagnosis present

## 2014-08-27 DIAGNOSIS — I340 Nonrheumatic mitral (valve) insufficiency: Secondary | ICD-10-CM | POA: Diagnosis present

## 2014-08-27 DIAGNOSIS — I509 Heart failure, unspecified: Secondary | ICD-10-CM | POA: Diagnosis present

## 2014-08-27 DIAGNOSIS — D689 Coagulation defect, unspecified: Secondary | ICD-10-CM | POA: Diagnosis present

## 2014-08-27 DIAGNOSIS — Z905 Acquired absence of kidney: Secondary | ICD-10-CM | POA: Diagnosis present

## 2014-08-27 DIAGNOSIS — E1122 Type 2 diabetes mellitus with diabetic chronic kidney disease: Secondary | ICD-10-CM | POA: Diagnosis present

## 2014-08-27 DIAGNOSIS — F439 Reaction to severe stress, unspecified: Secondary | ICD-10-CM | POA: Diagnosis present

## 2014-08-27 DIAGNOSIS — R0603 Acute respiratory distress: Secondary | ICD-10-CM

## 2014-08-27 DIAGNOSIS — J449 Chronic obstructive pulmonary disease, unspecified: Secondary | ICD-10-CM | POA: Diagnosis present

## 2014-08-27 DIAGNOSIS — E7800 Pure hypercholesterolemia, unspecified: Secondary | ICD-10-CM | POA: Diagnosis present

## 2014-08-27 DIAGNOSIS — I10 Essential (primary) hypertension: Secondary | ICD-10-CM | POA: Diagnosis present

## 2014-08-27 DIAGNOSIS — H3530 Unspecified macular degeneration: Secondary | ICD-10-CM | POA: Diagnosis present

## 2014-08-27 DIAGNOSIS — J9601 Acute respiratory failure with hypoxia: Secondary | ICD-10-CM | POA: Diagnosis not present

## 2014-08-27 DIAGNOSIS — K7460 Unspecified cirrhosis of liver: Secondary | ICD-10-CM | POA: Diagnosis present

## 2014-08-27 DIAGNOSIS — R0600 Dyspnea, unspecified: Secondary | ICD-10-CM

## 2014-08-27 DIAGNOSIS — I2510 Atherosclerotic heart disease of native coronary artery without angina pectoris: Secondary | ICD-10-CM | POA: Diagnosis present

## 2014-08-27 DIAGNOSIS — R0602 Shortness of breath: Secondary | ICD-10-CM | POA: Diagnosis present

## 2014-08-27 DIAGNOSIS — E039 Hypothyroidism, unspecified: Secondary | ICD-10-CM | POA: Diagnosis present

## 2014-08-27 HISTORY — DX: Other diseases of stomach and duodenum: K31.89

## 2014-08-27 HISTORY — DX: Esophageal varices with bleeding: I85.01

## 2014-08-27 HISTORY — DX: Portal hypertension: K76.6

## 2014-08-27 LAB — MRSA PCR SCREENING: MRSA by PCR: NEGATIVE

## 2014-08-27 LAB — GLUCOSE, CAPILLARY: GLUCOSE-CAPILLARY: 325 mg/dL — AB (ref 70–99)

## 2014-08-27 MED ORDER — ALLOPURINOL 300 MG PO TABS
300.0000 mg | ORAL_TABLET | Freq: Every day | ORAL | Status: DC
  Administered 2014-08-29 – 2014-08-30 (×2): 300 mg via ORAL
  Filled 2014-08-27 (×4): qty 1

## 2014-08-27 MED ORDER — LEVOTHYROXINE SODIUM 175 MCG PO TABS
175.0000 ug | ORAL_TABLET | Freq: Every day | ORAL | Status: DC
  Administered 2014-08-29 – 2014-08-30 (×2): 175 ug via ORAL
  Filled 2014-08-27 (×5): qty 1

## 2014-08-27 MED ORDER — SODIUM CHLORIDE 0.9 % IV SOLN
80.0000 mg | Freq: Once | INTRAVENOUS | Status: AC
  Administered 2014-08-27: 80 mg via INTRAVENOUS
  Filled 2014-08-27: qty 80

## 2014-08-27 MED ORDER — SODIUM CHLORIDE 0.9 % IV SOLN
250.0000 mL | INTRAVENOUS | Status: DC | PRN
  Administered 2014-08-28: 250 mL via INTRAVENOUS
  Administered 2014-08-28 – 2014-08-29 (×2): 10 mL/h via INTRAVENOUS

## 2014-08-27 MED ORDER — INSULIN GLARGINE 100 UNIT/ML ~~LOC~~ SOLN
10.0000 [IU] | Freq: Every day | SUBCUTANEOUS | Status: DC
  Administered 2014-08-27 – 2014-08-29 (×3): 10 [IU] via SUBCUTANEOUS
  Filled 2014-08-27 (×4): qty 0.1

## 2014-08-27 MED ORDER — INSULIN ASPART 100 UNIT/ML ~~LOC~~ SOLN: 1.0000 [IU] | SUBCUTANEOUS | Status: DC

## 2014-08-27 MED ORDER — CYCLOBENZAPRINE HCL 5 MG PO TABS
5.0000 mg | ORAL_TABLET | Freq: Every evening | ORAL | Status: DC | PRN
  Filled 2014-08-27: qty 1

## 2014-08-27 MED ORDER — ALPRAZOLAM 0.25 MG PO TABS
0.2500 mg | ORAL_TABLET | Freq: Three times a day (TID) | ORAL | Status: DC | PRN
  Administered 2014-08-29 – 2014-08-30 (×2): 0.25 mg via ORAL
  Filled 2014-08-27 (×2): qty 1

## 2014-08-27 MED ORDER — SODIUM CHLORIDE 0.9 % IV SOLN
8.0000 mg/h | INTRAVENOUS | Status: DC
  Administered 2014-08-27 – 2014-08-28 (×2): 8 mg/h via INTRAVENOUS
  Filled 2014-08-27 (×4): qty 80

## 2014-08-27 MED ORDER — VITAMIN D 50 MCG (2000 UT) PO CAPS: 2000.0000 [IU] | ORAL_CAPSULE | Freq: Every evening | ORAL | Status: DC

## 2014-08-27 MED ORDER — AMLODIPINE BESYLATE 10 MG PO TABS
10.0000 mg | ORAL_TABLET | Freq: Every day | ORAL | Status: DC
  Filled 2014-08-27 (×2): qty 1

## 2014-08-27 MED ORDER — CARVEDILOL 12.5 MG PO TABS
12.5000 mg | ORAL_TABLET | Freq: Two times a day (BID) | ORAL | Status: DC
  Administered 2014-08-27 – 2014-08-30 (×5): 12.5 mg via ORAL
  Filled 2014-08-27 (×10): qty 1

## 2014-08-27 MED ORDER — VITAMIN D3 25 MCG (1000 UNIT) PO TABS
2000.0000 [IU] | ORAL_TABLET | Freq: Every day | ORAL | Status: DC
  Administered 2014-08-27 – 2014-08-30 (×4): 2000 [IU] via ORAL
  Filled 2014-08-27 (×5): qty 2

## 2014-08-27 MED ORDER — LIDOCAINE HCL (PF) 1 % IJ SOLN
INTRAMUSCULAR | Status: AC
  Administered 2014-08-27
  Filled 2014-08-27: qty 5

## 2014-08-27 MED ORDER — ALBUTEROL SULFATE (2.5 MG/3ML) 0.083% IN NEBU
2.5000 mg | INHALATION_SOLUTION | RESPIRATORY_TRACT | Status: DC | PRN
  Administered 2014-08-28 – 2014-08-31 (×8): 2.5 mg via RESPIRATORY_TRACT
  Filled 2014-08-27 (×8): qty 3

## 2014-08-27 MED ORDER — INSULIN ASPART 100 UNIT/ML ~~LOC~~ SOLN
0.0000 [IU] | SUBCUTANEOUS | Status: DC
  Administered 2014-08-27: 11 [IU] via SUBCUTANEOUS
  Administered 2014-08-28: 3 [IU] via SUBCUTANEOUS
  Administered 2014-08-28: 2 [IU] via SUBCUTANEOUS
  Administered 2014-08-28: 3 [IU] via SUBCUTANEOUS
  Administered 2014-08-28: 11 [IU] via SUBCUTANEOUS
  Administered 2014-08-28: 3 [IU] via SUBCUTANEOUS
  Administered 2014-08-29: 2 [IU] via SUBCUTANEOUS
  Administered 2014-08-29: 3 [IU] via SUBCUTANEOUS
  Administered 2014-08-29: 2 [IU] via SUBCUTANEOUS
  Administered 2014-08-29: 3 [IU] via SUBCUTANEOUS
  Administered 2014-08-29: 2 [IU] via SUBCUTANEOUS

## 2014-08-27 MED ORDER — PANTOPRAZOLE SODIUM 40 MG IV SOLR: 40.0000 mg | Freq: Two times a day (BID) | INTRAVENOUS | Status: DC

## 2014-08-27 MED ORDER — ALBUTEROL SULFATE HFA 108 (90 BASE) MCG/ACT IN AERS: 1.0000 | INHALATION_SPRAY | RESPIRATORY_TRACT | Status: DC | PRN

## 2014-08-27 NOTE — H&P (Signed)
PULMONARY / CRITICAL CARE MEDICINE HISTORY AND PHYSICAL EXAMINATION   Name: Sara [name] MRN: [id number] DOB: 08-22-1925    ADMISSION DATE:  09/15/2014  PRIMARY SERVICE: PCCM  CHIEF COMPLAINT:  SOB  BRIEF PATIENT DESCRIPTION: 81 F with numerous medical issues some of the highlights of which include known PUD, DM, and ESRD on HD who presented to [facility] for SOB and hemoptysis likely 2/2 UGIB. She was transferred to [facility] for further management.   SIGNIFICANT EVENTS / STUDIES:  1. Hct 23.2 at OSH 2. Transfused 1 U PRBCs 09/05/2014  LINES / TUBES: PIV x 1  CULTURES: None  ANTIBIOTICS: None  HISTORY OF PRESENT ILLNESS:  [name] is an 81 yo F with a complex past medical history who presents to [facility] today as a transfer from [facility]. She presented there after complaining to her son that she felt more weak today and was a little short of breath. She first noted increased fatigue around Christmas where she was unable to pick up her legs to step up to a curb. On Monday, she felt increasing fatigue. She was unable to complete HD successfully for several days 2/2 hypotension. Today, she had several episodes of coffee ground emesis and several episodes of dark stool which prompted her son to take her to the ED. Her hct at the OSH was 23.2 and she was transfused 1 U of PRBCs and transferred to [facility] for further evaluation.  PAST MEDICAL HISTORY :  Past Medical History  Diagnosis Date  . Hypertension 1991  . Hypothyroidism 1985  . Hyperlipidemia 1980  . Chronic anemia   . Cataracts, bilateral   . CAD (coronary artery disease) 06/2001  . Chronic renal failure   . Diverticula of colon     (L)  . Hemorrhoids   . Hematuria 03/2002  . Diabetes mellitus 1995  . Mitral valve regurgitation   . Restless leg syndrome 12/05  . PUD (peptic ulcer disease)   . Osteopenia 2007    leftt hip   . Hyperparathyroidism   . Gastric polyps   . Cirrhosis   . Obesity   . Fatty  liver   . CHF (congestive heart failure)   . Blood transfusion   . Splenomegaly 2004    Thrombocytopenia  . Gout   . Heart murmur     .Followed by Dr Laverta Baltimore  PCP  . GERD (gastroesophageal reflux disease)   . Myelodysplastic syndrome     Skin cancer on leg removed- left leg  . Macular degeneration    Past Surgical History  Procedure Laterality Date  . Total abdominal hysterectomy  1966    fibroids   . Umbilical hernia repair  1960  . L-s bal surgery hemorrhoids  1984 & 1986  . Cholecystectomy  1970  . Appendectomy  1958  . Tonsillectomy    . Partial nephrectomy      right  . Upper gastrointestinal endoscopy  02/11/2009  . Upper gastrointestinal endoscopy  07/02/08  . Colonscopy    . Eye surgery      Cataract with Lens Bil  . Hemodialysis catheter  09/2011    Right Chest  . Cardiac catheterization      2002- Dr Martinique - No blockage  . Adrenalectomy      right  . [place] thora/paracentesis  10/12/2011    1870ml  removed  . Av fistula placement  12/21/2011    Procedure: ARTERIOVENOUS (AV) FISTULA CREATION;  Surgeon: Mal Misty, MD;  Location: [facility] OR;  Service: Vascular;  Laterality: Left;  Creation Left brachial cephalic arteriovenous fistula  . Kidney      rt removed  . Back surgery    . Nephrectomy      right  . Insertion of dialysis catheter N/A 04/30/2013    Procedure: INSERTION OF DIALYSIS CATHETER; ULTRASOUND GUIDED;  Surgeon: Mal Misty, MD;  Location: [place];  Service: Vascular;  Laterality: N/A;  . Fistulogram Left 05/30/2012    Procedure: FISTULOGRAM;  Surgeon: Elam Dutch, MD;  Location: [facility] CATH LAB;  Service: Cardiovascular;  Laterality: Left;  . Fistulogram Left 06/22/2013    Procedure: FISTULOGRAM;  Surgeon: Angelia Mould, MD;  Location: [facility] CATH LAB;  Service: Cardiovascular;  Laterality: Left;   Prior to Admission medications   Medication Sig Start Date End Date Taking? Authorizing Provider  allopurinol (ZYLOPRIM) 300 MG tablet TAKE 1 TABLET BY  MOUTH EVERY DAY 07/20/14   Chipper Herb, MD  amLODipine (NORVASC) 10 MG tablet TAKE 1 TABLET DAILY 01/22/14   Chipper Herb, MD  carvedilol (COREG) 25 MG tablet Take 0.5 tablets (12.5 mg total) by mouth 2 (two) times daily with a meal. 10/12/13   Vernie Shanks, MD  Cholecalciferol (VITAMIN D) 2000 UNITS CAPS Take 2,000 Units by mouth every evening.     Historical Provider, MD  cloNIDine (CATAPRES) 0.2 MG tablet TAKE 1 TABLET BY MOUTH TWICE DAILY    Vernie Shanks, MD  cyclobenzaprine (FLEXERIL) 5 MG tablet TAKE 1 TABLET BY MOUTH AT BEDTIME AS NEEDED MUSCLE SPASMS 06/18/14   Mary-Margaret Hassell Done, FNP  folic acid (FOLVITE) 409 MCG tablet Take 400 mcg by mouth daily.    Historical Provider, MD  glimepiride (AMARYL) 2 MG tablet TAKE 1 TABLET BY MOUTH EVERY DAY BEFORE BREAKFAST 08/16/14   Mary-Margaret Hassell Done, FNP  ipratropium-albuterol (DUONEB) 0.5-2.5 (3) MG/3ML SOLN Take 3 mLs by nebulization 4 (four) times daily.    Historical Provider, MD  LANTUS 100 UNIT/ML injection INJECT 20 TO 25 UNITS SUBCUTANEOUSLY AT BEDTIME 08/16/14   Mary-Margaret Hassell Done, FNP  levothyroxine (SYNTHROID, LEVOTHROID) 175 MCG tablet Take 1 tablet (175 mcg total) by mouth daily. 08/02/14   Chipper Herb, MD  Multiple Vitamins-Minerals (PRESERVISION AREDS PO) Take 1 capsule by mouth 2 (two) times daily.     Historical Provider, MD  Omega-3 Fatty Acids (FISH OIL) 1000 MG CAPS Take 1,000 mg by mouth every morning.     Historical Provider, MD  omeprazole (PRILOSEC OTC) 20 MG tablet Take 20 mg by mouth daily.      Historical Provider, MD  RENVELA 800 MG tablet Take 800 mg by mouth 3 (three) times daily with meals.  03/18/12   Historical Provider, MD  vitamin B-12 (CYANOCOBALAMIN) 1000 MCG tablet Take 1,000 mcg by mouth every morning.      Historical Provider, MD   Allergies  Allergen Reactions  . Aspirin Adult Low [Aspirin] Other (See Comments)    Ulcer  . Nsaids Nausea And Vomiting    Reaction unknown  . Sulfa Antibiotics  Other (See Comments)    Rash    FAMILY HISTORY:  Family History  Problem Relation Age of Onset  . Heart disease Father   . Diabetes Father   . Stroke Father   . Diabetes Sister   . Healthy Sister   . Healthy Son   . Healthy Son   . Healthy Daughter   . Anesthesia problems Neg Hx    SOCIAL HISTORY:  reports that she has never smoked. She has never used smokeless tobacco. She reports that she does not drink alcohol or use illicit drugs.  REVIEW OF SYSTEMS:  A 12-system ROS was conducted and, unless otherwise specified in the HPI, was negative.   SUBJECTIVE:   VITAL SIGNS: Temp:  [97.6 F (36.4 C)-97.7 F (36.5 C)] 97.6 F (36.4 C) (01/01 1915) Pulse Rate:  [108-116] 108 (01/01 1915) Resp:  [16-21] 16 (01/01 1915) BP: (168)/(52) 168/52 mmHg (01/01 1856) SpO2:  [98 %-100 %] 99 % (01/01 1915) HEMODYNAMICS:   VENTILATOR SETTINGS:   INTAKE / OUTPUT: Intake/Output    None     PHYSICAL EXAMINATION: General:  Elderly F in NAD Neuro:  Intact HEENT:  Sclera anicteric, conjunctiva pale, MMM, OP Clear, Dried Old Blood Around Mouth  Neck: Obese, no clear JVD, L EJ present, (-) Stridor or Upper Airway Wheezing Cardiovascular:  Tachy, RR, NS1/S2, (-) MRG Lungs:  CTAB Abdomen:  S/NT/ND/(+)BS Musculoskeletal:  1+ edema to knees bilaterally Skin:  Diffuse echymosis  LABS:  CBC No results for input(s): WBC, HGB, HCT, PLT in the last 168 hours. Coag's No results for input(s): APTT, INR in the last 168 hours. BMET No results for input(s): NA, K, CL, CO2, BUN, CREATININE, GLUCOSE in the last 168 hours. Electrolytes No results for input(s): CALCIUM, MG, PHOS in the last 168 hours. Sepsis Markers No results for input(s): LATICACIDVEN, PROCALCITON, O2SATVEN in the last 168 hours. ABG No results for input(s): PHART, PCO2ART, PO2ART in the last 168 hours. Liver Enzymes No results for input(s): AST, ALT, ALKPHOS, BILITOT, ALBUMIN in the last 168 hours. Cardiac Enzymes No  results for input(s): TROPONINI, PROBNP in the last 168 hours. Glucose No results for input(s): GLUCAP in the last 168 hours.  Imaging No results found.  EKG: None yet CXR: None yet  ASSESSMENT / PLAN:  Active Problems:   Hypertension   Cirrhosis   Diabetes mellitus   Hypothyroidism   High cholesterol   End stage renal disease   Restless legs syndrome   PULMONARY A: Wheezing: Per patient there is some concern that her thyroid is pushing her trachea but there is no evidence on exam at this time. COPD: P:   PRN albuterol  CARDIOVASCULAR A: Chart History of HF: Unknown systolic/diastolic. Chart History of CAD: Details unknown. Unable to tolerate Asa per patient. HTN: HL: P:   Continue home regimen  RENAL A: ESRD:  P:   HD Per Renal (Discussed with Dr. Joelyn Oms on 1/1)  GASTROINTESTINAL A: GIB: Presumed UGIB, suspect known PUD. HD stable.  P:   NPO after midnight T+C 2 units Place 2nd IV Serial CBCs Protonix Drip GI consult (called evening of 1/1, they will see AM of 1/2)  HEMATOLOGIC A: Acute Blood Loss Anemia:  P:     INFECTIOUS A: No acute issues  ENDOCRINE A: Hypothyroidism:  DM: P:   Decrease Lantus to 10 U SSI Continue OP Levothyroxin Check TSH  NEUROLOGIC A: No acute issues  MUSCULOSKELETAL: Gout: P: Cont Allopurinol  OUTPATIENT MEDICATION REGIMEN: Please note, Ms. Ramsuer's medication history was taken from the transfer paperwork from [facility] as she could not name any of her medications. Her medication list should be reconciled with that from her    GOALS of CARE: I discussed Ms. Ramsuer's goals of care with her in the presence of her family. She would like to be DNR/DNI but would like to continue with other aggressive measures.   BEST PRACTICE / DISPOSITION  Level of Care:  ICU Primary Service:  PCCM Consultants:  GI, Renal Code Status:  DNR/DNI Diet:  NPO after midnight DVT Px:  SCDs GI Px:  PPI Skin  Integrity:  Intact Social / Family:  Updated at bedside  TODAY'S SUMMARY:   I have personally obtained a history, examined the patient, evaluated laboratory and imaging results, formulated the assessment and plan and placed orders.  CRITICAL CARE: The patient is critically ill with multiple organ systems failure and requires high complexity decision making for assessment and support, frequent evaluation and titration of therapies, application of advanced monitoring technologies and extensive interpretation of multiple databases. Critical Care Time devoted to patient care services described in this note is 60 minutes.   Margarette Asal, MD Pulmonary and [place] Pager: [phone]   09/01/2014, 7:25 PM

## 2014-08-27 NOTE — Procedures (Signed)
Central Venous Catheter Insertion Procedure Note Sara [name] [id number] January 26, 1925  Procedure: Insertion of Central Venous Catheter Indications: Drug and/or fluid administration and Frequent blood sampling  Procedure Details Consent: Risks of procedure as well as the alternatives and risks of each were explained to the (patient/caregiver).  Consent for procedure obtained. Time Out: Verified patient identification, verified procedure, site/side was marked, verified correct patient position, special equipment/implants available, medications/allergies/relevent history reviewed, required imaging and test results available.  Performed  Maximum sterile technique was used including antiseptics, cap, gloves, gown, hand hygiene, mask and sheet. Skin prep: Chlorhexidine; local anesthetic administered A antimicrobial bonded/coated triple lumen catheter was placed in the right femoral vein due to multiple attempts, no other available access using the Seldinger technique.  Evaluation Blood flow good Complications: No apparent complications Patient did tolerate procedure well.  Jehiel Koepp R. 08/31/2014, 11:55 PM  I used ultrasound to locate and access the vein/artery.

## 2014-08-28 ENCOUNTER — Encounter (HOSPITAL_COMMUNITY): Payer: Self-pay | Admitting: Gastroenterology

## 2014-08-28 ENCOUNTER — Encounter (HOSPITAL_COMMUNITY): Admission: AD | Disposition: E | Payer: Self-pay | Source: Other Acute Inpatient Hospital | Attending: Internal Medicine

## 2014-08-28 DIAGNOSIS — K922 Gastrointestinal hemorrhage, unspecified: Secondary | ICD-10-CM

## 2014-08-28 DIAGNOSIS — I8501 Esophageal varices with bleeding: Secondary | ICD-10-CM | POA: Diagnosis present

## 2014-08-28 DIAGNOSIS — K3189 Other diseases of stomach and duodenum: Secondary | ICD-10-CM

## 2014-08-28 DIAGNOSIS — K766 Portal hypertension: Secondary | ICD-10-CM

## 2014-08-28 DIAGNOSIS — K745 Biliary cirrhosis, unspecified: Secondary | ICD-10-CM

## 2014-08-28 HISTORY — DX: Other diseases of stomach and duodenum: K31.89

## 2014-08-28 HISTORY — PX: ESOPHAGOGASTRODUODENOSCOPY: SHX5428

## 2014-08-28 HISTORY — DX: Esophageal varices with bleeding: I85.01

## 2014-08-28 LAB — APTT
APTT: 24 s (ref 24–37)
aPTT: 27 seconds (ref 24–37)

## 2014-08-28 LAB — CBC
HCT: 27 % — ABNORMAL LOW (ref 36.0–46.0)
HCT: 23.7 % — ABNORMAL LOW (ref 36.0–46.0)
HEMATOCRIT: 24.8 % — AB (ref 36.0–46.0)
HEMOGLOBIN: 8.5 g/dL — AB (ref 12.0–15.0)
Hemoglobin: 8.3 g/dL — ABNORMAL LOW (ref 12.0–15.0)
Hemoglobin: 7.7 g/dL — ABNORMAL LOW (ref 12.0–15.0)
MCH: 32.8 pg (ref 26.0–34.0)
MCH: 33 pg (ref 26.0–34.0)
MCH: 34 pg (ref 26.0–34.0)
MCHC: 33.3 g/dL (ref 30.0–36.0)
MCHC: 32.3 g/dL (ref 30.0–36.0)
MCHC: 32.5 g/dL (ref 30.0–36.0)
MCV: 101.6 fL — ABNORMAL HIGH (ref 78.0–100.0)
MCV: 101.7 fL — AB (ref 78.0–100.0)
MCV: 102 fL — AB (ref 78.0–100.0)
Platelets: 54 10*3/uL — ABNORMAL LOW (ref 150–400)
Platelets: 62 10*3/uL — ABNORMAL LOW (ref 150–400)
Platelets: 56 10*3/uL — ABNORMAL LOW (ref 150–400)
RBC: 2.33 MIL/uL — AB (ref 3.87–5.11)
RBC: 2.44 MIL/uL — ABNORMAL LOW (ref 3.87–5.11)
RBC: 2.5 MIL/uL — ABNORMAL LOW (ref 3.87–5.11)
RDW: 24.3 % — AB (ref 11.5–15.5)
WBC: 25.5 10*3/uL — ABNORMAL HIGH (ref 4.0–10.5)
WBC: 24.4 10*3/uL — ABNORMAL HIGH (ref 4.0–10.5)
WBC: 22.3 10*3/uL — ABNORMAL HIGH (ref 4.0–10.5)

## 2014-08-28 LAB — PROTIME-INR
INR: 1.53 — ABNORMAL HIGH (ref 0.00–1.49)
INR: 1.65 — ABNORMAL HIGH (ref 0.00–1.49)
Prothrombin Time: 18.5 seconds — ABNORMAL HIGH (ref 11.6–15.2)
Prothrombin Time: 19.7 seconds — ABNORMAL HIGH (ref 11.6–15.2)

## 2014-08-28 LAB — BASIC METABOLIC PANEL
ANION GAP: 19 — AB (ref 5–15)
BUN: 105 mg/dL — ABNORMAL HIGH (ref 6–23)
CALCIUM: 8.1 mg/dL — AB (ref 8.4–10.5)
CO2: 24 mmol/L (ref 19–32)
CREATININE: 4.8 mg/dL — AB (ref 0.50–1.10)
Chloride: 96 mEq/L (ref 96–112)
GFR calc Af Amer: 9 mL/min — ABNORMAL LOW (ref >90)
GFR, EST NON AFRICAN AMERICAN: 7 mL/min — AB (ref >90)
Glucose, Bld: 95 mg/dL (ref 70–99)
POTASSIUM: 4.5 mmol/L (ref 3.5–5.1)
SODIUM: 139 mmol/L (ref 135–145)

## 2014-08-28 LAB — POCT I-STAT 3, ART BLOOD GAS (G3+)
Acid-Base Excess: 2 mmol/L (ref 0.0–2.0)
BICARBONATE: 27.1 meq/L — AB (ref 20.0–24.0)
O2 SAT: 56 %
TCO2: 28 mmol/L (ref 0–100)
pCO2 arterial: 45 mmHg (ref 35.0–45.0)
pH, Arterial: 7.389 (ref 7.350–7.450)
pO2, Arterial: 30 mmHg — CL (ref 80.0–100.0)

## 2014-08-28 LAB — COMPREHENSIVE METABOLIC PANEL
ALT: 30 U/L (ref 0–35)
AST: 27 U/L (ref 0–37)
Albumin: 2.4 g/dL — ABNORMAL LOW (ref 3.5–5.2)
Alkaline Phosphatase: 94 U/L (ref 39–117)
Anion gap: 19 — ABNORMAL HIGH (ref 5–15)
BUN: 97 mg/dL — ABNORMAL HIGH (ref 6–23)
CHLORIDE: 94 meq/L — AB (ref 96–112)
CO2: 23 mmol/L (ref 19–32)
Calcium: 8.1 mg/dL — ABNORMAL LOW (ref 8.4–10.5)
Creatinine, Ser: 4.66 mg/dL — ABNORMAL HIGH (ref 0.50–1.10)
GFR calc non Af Amer: 8 mL/min — ABNORMAL LOW (ref >90)
GFR, EST AFRICAN AMERICAN: 9 mL/min — AB (ref >90)
Glucose, Bld: 340 mg/dL — ABNORMAL HIGH (ref 70–99)
Potassium: 4.6 mmol/L (ref 3.5–5.1)
Sodium: 136 mmol/L (ref 135–145)
Total Bilirubin: 0.6 mg/dL (ref 0.3–1.2)
Total Protein: 4.3 g/dL — ABNORMAL LOW (ref 6.0–8.3)

## 2014-08-28 LAB — GLUCOSE, CAPILLARY
GLUCOSE-CAPILLARY: 195 mg/dL — AB (ref 70–99)
Glucose-Capillary: 103 mg/dL — ABNORMAL HIGH (ref 70–99)
Glucose-Capillary: 133 mg/dL — ABNORMAL HIGH (ref 70–99)
Glucose-Capillary: 173 mg/dL — ABNORMAL HIGH (ref 70–99)
Glucose-Capillary: 191 mg/dL — ABNORMAL HIGH (ref 70–99)
Glucose-Capillary: 333 mg/dL — ABNORMAL HIGH (ref 70–99)
Glucose-Capillary: 57 mg/dL — ABNORMAL LOW (ref 70–99)

## 2014-08-28 LAB — MAGNESIUM
MAGNESIUM: 1.9 mg/dL (ref 1.5–2.5)
Magnesium: 1.8 mg/dL (ref 1.5–2.5)

## 2014-08-28 LAB — TSH: TSH: 3.074 u[IU]/mL (ref 0.350–4.500)

## 2014-08-28 LAB — TROPONIN I
TROPONIN I: 0.17 ng/mL — AB (ref <0.031)
Troponin I: 0.18 ng/mL — ABNORMAL HIGH (ref <0.031)
Troponin I: 0.18 ng/mL — ABNORMAL HIGH (ref <0.031)

## 2014-08-28 LAB — PHOSPHORUS
PHOSPHORUS: 8.2 mg/dL — AB (ref 2.3–4.6)
Phosphorus: 8.3 mg/dL — ABNORMAL HIGH (ref 2.3–4.6)

## 2014-08-28 LAB — LACTIC ACID, PLASMA: Lactic Acid, Venous: 3.8 mmol/L — ABNORMAL HIGH (ref 0.5–2.2)

## 2014-08-28 SURGERY — EGD (ESOPHAGOGASTRODUODENOSCOPY)
Anesthesia: Moderate Sedation

## 2014-08-28 MED ORDER — SODIUM CHLORIDE 0.9 % IJ SOLN: 10.0000 mL | INTRAMUSCULAR | Status: DC | PRN

## 2014-08-28 MED ORDER — FENTANYL CITRATE 0.05 MG/ML IJ SOLN
INTRAMUSCULAR | Status: AC
  Filled 2014-08-28: qty 2

## 2014-08-28 MED ORDER — FENTANYL CITRATE 0.05 MG/ML IJ SOLN
INTRAMUSCULAR | Status: DC | PRN
Start: 2014-08-28 — End: 2014-08-28
  Administered 2014-08-28: 12.5 ug via INTRAVENOUS

## 2014-08-28 MED ORDER — DEXTROSE 5 % IV SOLN
2.0000 mg | Freq: Once | INTRAVENOUS | Status: AC
  Administered 2014-08-28: 2 mg via INTRAVENOUS
  Filled 2014-08-28: qty 0.2

## 2014-08-28 MED ORDER — MIDAZOLAM HCL 10 MG/2ML IJ SOLN
INTRAMUSCULAR | Status: DC | PRN
  Administered 2014-08-28: 1 mg via INTRAVENOUS

## 2014-08-28 MED ORDER — DEXTROSE 50 % IV SOLN
INTRAVENOUS | Status: AC
  Filled 2014-08-28: qty 50

## 2014-08-28 MED ORDER — DEXTROSE 50 % IV SOLN
25.0000 mL | Freq: Once | INTRAVENOUS | Status: AC
  Administered 2014-08-28: 25 mL via INTRAVENOUS

## 2014-08-28 MED ORDER — ACETAMINOPHEN 325 MG PO TABS
325.0000 mg | ORAL_TABLET | Freq: Four times a day (QID) | ORAL | Status: DC | PRN
  Administered 2014-08-28 – 2014-08-31 (×6): 325 mg via ORAL
  Filled 2014-08-28 (×5): qty 1

## 2014-08-28 MED ORDER — DEXTROSE 50 % IV SOLN: 25.0000 mL | Freq: Once | INTRAVENOUS | Status: DC

## 2014-08-28 MED ORDER — SODIUM CHLORIDE 0.9 % IJ SOLN
10.0000 mL | Freq: Two times a day (BID) | INTRAMUSCULAR | Status: DC
  Administered 2014-08-28 – 2014-08-29 (×3): 10 mL
  Administered 2014-08-30 (×2): 20 mL
  Administered 2014-08-31: 10 mL
  Administered 2014-08-31 – 2014-09-01 (×2): 30 mL
  Administered 2014-09-02 (×2): 10 mL

## 2014-08-28 MED ORDER — PANTOPRAZOLE SODIUM 40 MG PO TBEC
40.0000 mg | DELAYED_RELEASE_TABLET | Freq: Every day | ORAL | Status: DC
  Administered 2014-08-29 – 2014-08-30 (×2): 40 mg via ORAL
  Filled 2014-08-28 (×2): qty 1

## 2014-08-28 MED ORDER — BUTAMBEN-TETRACAINE-BENZOCAINE 2-2-14 % EX AERO
INHALATION_SPRAY | CUTANEOUS | Status: DC | PRN
  Administered 2014-08-28: 2 via TOPICAL

## 2014-08-28 MED ORDER — MIDAZOLAM HCL 5 MG/ML IJ SOLN
INTRAMUSCULAR | Status: AC
  Filled 2014-08-28: qty 2

## 2014-08-28 MED ORDER — CEFTRIAXONE SODIUM IN DEXTROSE 20 MG/ML IV SOLN
1.0000 g | INTRAVENOUS | Status: DC
  Administered 2014-08-29 – 2014-08-30 (×2): 1 g via INTRAVENOUS
  Filled 2014-08-28 (×4): qty 50

## 2014-08-28 NOTE — Consult Note (Signed)
Sara [name] Admit Date: 09/23/2014 09/26/2014 Rexene Agent Requesting Physician:  Lowry Ram MD  Reason for Consult:  comanagement of ESRd HPI:  8F ESRD MWF [facility] via LUE AVF admitted with anemia, SOB, melena, hematemesis.  She underwent EGD today with bleeding from esophageal varices, banded.    Last HD 08/27/13 at and developed end of treatment hypotension / syncope prompting eval at [facility].  No recent HD issues, AVF working well.     CREAT (mg/dL)  Date Value  08/10/2011 2.35*   CREATININE, SER (mg/dL)  Date Value  09/18/2014 4.80*  09/05/2014 4.66*  06/22/2013 8.60*  05/30/2012 3.10*  12/21/2011 2.44*  ] I/Os: I/O last 3 completed shifts: In: 339.8 [I.V.:289.8; IV Piggyback:50] Out: -    ROS Balance of 12 systems is negative w/ exceptions as above  PMH  Past Medical History  Diagnosis Date  . Hypertension 1991  . Hypothyroidism 1985  . Hyperlipidemia 1980  . Chronic anemia   . Cataracts, bilateral   . CAD (coronary artery disease) 06/2001  . Chronic renal failure   . Diverticula of colon     (L)  . Hemorrhoids   . Hematuria 03/2002  . Diabetes mellitus 1995  . Mitral valve regurgitation   . Restless leg syndrome 12/05  . PUD (peptic ulcer disease)   . Osteopenia 2007    leftt hip   . Hyperparathyroidism   . Gastric polyps   . Cirrhosis   . Obesity   . Fatty liver   . CHF (congestive heart failure)   . Blood transfusion   . Splenomegaly 2004    Thrombocytopenia  . Gout   . Heart murmur     .Followed by Dr Laverta Baltimore  PCP  . GERD (gastroesophageal reflux disease)   . Myelodysplastic syndrome     Skin cancer on leg removed- left leg  . Macular degeneration   . Esophageal varices with bleeding 09/08/2014  . Portal hypertensive gastropathy 09/15/2014   PSH  Past Surgical History  Procedure Laterality Date  . Total abdominal hysterectomy  1966    fibroids   . Umbilical hernia repair  1960  . L-s bal surgery hemorrhoids  1984 & 1986  .  Cholecystectomy  1970  . Appendectomy  1958  . Tonsillectomy    . Partial nephrectomy      right  . Upper gastrointestinal endoscopy  02/11/2009  . Upper gastrointestinal endoscopy  07/02/08  . Colonscopy    . Eye surgery      Cataract with Lens Bil  . Hemodialysis catheter  09/2011    Right Chest  . Cardiac catheterization      2002- Dr Martinique - No blockage  . Adrenalectomy      right  . [place] thora/paracentesis  10/12/2011    184ml  removed  . Av fistula placement  12/21/2011    Procedure: ARTERIOVENOUS (AV) FISTULA CREATION;  Surgeon: Mal Misty, MD;  Location: [facility] OR;  Service: Vascular;  Laterality: Left;  Creation Left brachial cephalic arteriovenous fistula  . Kidney      rt removed  . Back surgery    . Nephrectomy      right  . Insertion of dialysis catheter N/A 04/30/2013    Procedure: INSERTION OF DIALYSIS CATHETER; ULTRASOUND GUIDED;  Surgeon: Mal Misty, MD;  Location: [place];  Service: Vascular;  Laterality: N/A;  . Fistulogram Left 05/30/2012    Procedure: FISTULOGRAM;  Surgeon: Elam Dutch, MD;  Location: [facility] CATH LAB;  Service: Cardiovascular;  Laterality: Left;  . Fistulogram Left 06/22/2013    Procedure: FISTULOGRAM;  Surgeon: Angelia Mould, MD;  Location: [facility] CATH LAB;  Service: Cardiovascular;  Laterality: Left;   FH  Family History  Problem Relation Age of Onset  . Heart disease Father   . Diabetes Father   . Stroke Father   . Diabetes Sister   . Healthy Sister   . Healthy Son   . Healthy Son   . Healthy Daughter   . Anesthesia problems Neg Hx    SH  reports that she has never smoked. She has never used smokeless tobacco. She reports that she does not drink alcohol or use illicit drugs. Allergies  Allergies  Allergen Reactions  . Aspirin Adult Low [Aspirin] Other (See Comments)    Bleeding ulcers  . Nsaids Other (See Comments)    Bleeding ulcers  . Sulfa Antibiotics Rash    Rash   Home medications Prior to Admission medications    Medication Sig Start Date End Date Taking? Authorizing Provider  albuterol (PROVENTIL) (2.5 MG/3ML) 0.083% nebulizer solution Take 2.5 mg by nebulization See admin instructions. Inhale 1 vial 4 times daily and as needed for shortness of breath or wheezing   Yes Historical Provider, MD  Cholecalciferol (VITAMIN D) 2000 UNITS tablet Take 2,000 Units by mouth daily.   Yes Historical Provider, MD  Fluticasone-Salmeterol (ADVAIR DISKUS IN) Inhale 1 puff into the lungs 2 (two) times daily.   Yes Historical Provider, MD  FOLIC ACID PO Take 1 tablet by mouth daily.   Yes Historical Provider, MD  LANTUS 100 UNIT/ML injection INJECT 20 TO 25 UNITS SUBCUTANEOUSLY AT BEDTIME 08/16/14  Yes Mary-Margaret Hassell Done, FNP  Multiple Vitamins-Minerals (PRESERVISION AREDS PO) Take 1 tablet by mouth 2 (two) times daily.   Yes Historical Provider, MD  Omega-3 Fatty Acids (FISH OIL) 1000 MG CAPS Take 1,000 mg by mouth daily.   Yes Historical Provider, MD  omeprazole (PRILOSEC OTC) 20 MG tablet Take 20 mg by mouth daily.   Yes Historical Provider, MD  sevelamer carbonate (RENVELA) 800 MG tablet Take 800-2,400 mg by mouth See admin instructions. Take 3 tablets (2400 mg) with meals and 1 tablet (800 mg) with snacks (Do not take if snack is fruit)   Yes Historical Provider, MD  albuterol (PROVENTIL HFA;VENTOLIN HFA) 108 (90 BASE) MCG/ACT inhaler Inhale 1 puff into the lungs every 4 (four) hours as needed for wheezing or shortness of breath.    Historical Provider, MD  allopurinol (ZYLOPRIM) 300 MG tablet TAKE 1 TABLET BY MOUTH EVERY DAY 07/20/14   Chipper Herb, MD  ALPRAZolam Duanne Moron) 0.5 MG tablet Take 0.5 mg by mouth 3 (three) times daily as needed for anxiety.    Historical Provider, MD  amLODipine (NORVASC) 10 MG tablet TAKE 1 TABLET DAILY 01/22/14   Chipper Herb, MD  amLODipine (NORVASC) 5 MG tablet  08/18/14   Historical Provider, MD  carvedilol (COREG) 25 MG tablet Take 0.5 tablets (12.5 mg total) by mouth 2 (two) times  daily with a meal. 10/12/13   Vernie Shanks, MD  cefUROXime (CEFTIN) 500 MG tablet  08/09/14   Historical Provider, MD  Cholecalciferol (VITAMIN D) 2000 UNITS CAPS Take 2,000 Units by mouth every evening.     Historical Provider, MD  cyclobenzaprine (FLEXERIL) 5 MG tablet TAKE 1 TABLET BY MOUTH AT BEDTIME AS NEEDED MUSCLE SPASMS 06/18/14   Mary-Margaret Hassell Done, FNP  glimepiride (AMARYL) 2 MG tablet TAKE 1 TABLET BY  MOUTH EVERY DAY BEFORE BREAKFAST 08/16/14   Mary-Margaret Hassell Done, FNP  levothyroxine (SYNTHROID, LEVOTHROID) 175 MCG tablet Take 1 tablet (175 mcg total) by mouth daily. 08/02/14   Chipper Herb, MD  predniSONE (DELTASONE) 20 MG tablet  08/23/14   Historical Provider, MD  RENVELA 800 MG tablet  08/18/14   Historical Provider, MD    Current Medications Scheduled Meds: . allopurinol  300 mg Oral Daily  . amLODipine  10 mg Oral Daily  . carvedilol  12.5 mg Oral BID WC  . cefTRIAXone (ROCEPHIN)  IV  1 g Intravenous Q24H  . cholecalciferol  2,000 Units Oral QHS  . insulin aspart  0-15 Units Subcutaneous 6 times per day  . insulin glargine  10 Units Subcutaneous QHS  . levothyroxine  175 mcg Oral QAC breakfast  . [START ON 08/29/2014] pantoprazole  40 mg Oral QAC breakfast   Continuous Infusions:  PRN Meds:.sodium chloride, albuterol, ALPRAZolam, cyclobenzaprine  CBC  Recent Labs Lab 09/05/2014 0017 09/26/2014 0407 09/11/2014 1207  WBC 25.5* 24.4* 22.3*  HGB 8.5* 8.3* 7.7*  HCT 27.0* 24.8* 23.7*  MCV 102.0* 101.6* 101.7*  PLT 54* 62* 56*   Basic Metabolic Panel  Recent Labs Lab 09/13/2014 0017 09/21/2014 0407  NA 136 139  K 4.6 4.5  CL 94* 96  CO2 23 24  GLUCOSE 340* 95  BUN 97* 105*  CREATININE 4.66* 4.80*  CALCIUM 8.1* 8.1*  PHOS 8.3* 8.2*    Physical Exam  Blood pressure 109/49, pulse 93, temperature 97.7 F (36.5 C), temperature source Oral, resp. rate 18, weight 92.307 kg (203 lb 8 oz), SpO2 100 %. GEN: NAD, obese, in bed lying flat  ENT: NCAT EYES: EOMI CV:  RRR, no rub PULM: CTAB, nl WOB ABD: obese, s/nt/nd SKIN: no LEE EXT:No LEE VASC: LUE AVF + B/T   Assessment/Plan 45F ESRD MWF at [facility] admit with UGIB 2/2 Esophageal varices, banded 08/28/13  1. ESRD 1. No need for HD today, tentative plan for 08/30/13, follow daily 2. Will obtain outpt records 3. K/HCO3 acceptable 2. UGIB / ABLA 1. Will review outpt ESA management when available 2. Transfusion per GI / CCM 3. Cirrhosis 1. Per GI 4. 2HPTH / Hyperphosphatemia 1. Resume binder when eating 2. Need outpt records 5. HTN/Vol:  1. home meds amlodipine and carvedilol, continue 2. Vol status appears stable currently  Pearson Grippe MD [phone] pgr 09/12/2014, 1:06 PM

## 2014-08-28 NOTE — Progress Notes (Signed)
Results for CAMRON, ESSMAN (MRN [id number]) as of 09/24/2014 00:36  Ref. Range 09/04/2014 23:44  Sample type No range found ARTERIAL  pH, Arterial Latest Range: 7.350-7.450  7.389  pCO2 arterial Latest Range: 35.0-45.0 mmHg 45.0  pO2, Arterial Latest Range: 80.0-100.0 mmHg 30.0 (LL)  Bicarbonate Latest Range: 20.0-24.0 mEq/L 27.1 (H)  TCO2 Latest Range: 0-100 mmol/L 28  Acid-Base Excess Latest Range: 0.0-2.0 mmol/L 2.0  O2 Saturation No range found 56.0   Venous sample, MD aware.

## 2014-08-28 NOTE — Plan of Care (Signed)
Problem: Phase I Progression Outcomes Goal: OOB as tolerated unless otherwise ordered Outcome: Not Met (add Reason) Pt with poor IV access, Rt fem. TLC placed for Abx/IVF use. Goal: Initial discharge plan identified Outcome: Completed/Met Date Met:  09/20/2014 EGD completed 09/26/2014 with clipping of varices.

## 2014-08-28 NOTE — Op Note (Signed)
[facility] [place], [id number]   ENDOSCOPY PROCEDURE REPORT  PATIENT: Sara, [name]  MR#: [id number] BIRTHDATE: 1925/07/28 , 89  yrs. old GENDER: female ENDOSCOPIST: [name], MD, [facility] PROCEDURE DATE:  08/27/2014 PROCEDURE:  EGD w/ band ligation of varices ASA CLASS:     Class III INDICATIONS:  melena/GI bleed. MEDICATIONS: 12.5 and Versed 1 mg IV TOPICAL ANESTHETIC: Cetacaine Spray  DESCRIPTION OF PROCEDURE: After the risks benefits and alternatives of the procedure were thoroughly explained, informed consent was obtained.  The Pentax Gastroscope [id number] endoscope was introduced through the mouth and advanced to the second portion of the duodenum , Without limitations.  The instrument was slowly withdrawn as the mucosa was fully examined.  1) 3 columns of small esophageal varices with red wale signs in distal esophagus - 3 bands placed with good results 2) Patchy osaic mucosal change in proximal stomach with some red spots - mild portal gastropathy 3) Otherwise normal EGD.  Retroflexed views revealed as previously described.     The scope was then withdrawn from the patient and the procedure completed.  COMPLICATIONS: There were no immediate complications.  ENDOSCOPIC IMPRESSION: 1) 3 columns of small esophageal varices with red wale signs in distal esophagus - 3 bands placed with good results 2) Patchy mosaic mucosal change in proximal stomach with some red spots - mild portal gastropathy 3) Otherwise normal EGD  RECOMMENDATIONS: 1) Advance diet 2) po PPI 3) IV ceftriaxone infection prophylaxis 4) hold off on octreotide 5) is on carvedilol - continue 6) consider repeating an EGD in about 1 month for reassessment and possible repeat banding (could go back to Dr. Laural Golden vs see me   eSigned:  [name], MD, [facility] 09/17/2014 11:54 AM

## 2014-08-28 NOTE — Consult Note (Signed)
Consultation  Referring Provider:  CCM    Primary Care Physician:  Anthoney Harada, MD Primary Gastroenterologist:  Teena Irani, MD and Hildred Laser, MD in past Reason for Consultation:  GI bleed            HPI:   Sara [name] is a 81 y.o. female with multiple medical problems including ESRD on HD who was transferred from [facility] yesterday where she was hospitalized for SOB. While hospitalized patient developed melena /hematemesis. Hct 23, she got a unit of blood prior to transfer.  A second unit was in progress but IV infiltrated and she only got 164ml.   Patient gives a remote history of upper GI bleeding secondary to a "silent ulcer". She has been on a daily PPI for 10 years. No NSAID use. No abdominal pain.    Has had colonoscopy 10+ yrs ago and also has had EGD's with GI note 2012 indicating she has had 2+ esophageal varices but I am not aware if they have bled - cannot find evidence of that  Past Medical History  Diagnosis Date  . Hypertension 1991  . Hypothyroidism 1985  . Hyperlipidemia 1980  . Chronic anemia   . Cataracts, bilateral   . CAD (coronary artery disease) 06/2001  . Chronic renal failure   . Diverticula of colon     (L)  . Hemorrhoids   . Diabetes mellitus 1995  . Mitral valve regurgitation   . Restless leg syndrome 12/05  . PUD (peptic ulcer disease)   . Osteopenia 2007    leftt hip   . Hyperparathyroidism   . Gastric polyps   . Cirrhosis   . Obesity   . Fatty liver   . CHF (congestive heart failure)   . Blood transfusion   . Splenomegaly 2004    Thrombocytopenia  . Gout   . Heart murmur     .Followed by Dr Laverta Baltimore  PCP  . GERD (gastroesophageal reflux disease)   . Myelodysplastic syndrome     Skin cancer on leg removed- left leg  . Macular degeneration     Past Surgical History  Procedure Laterality Date  . Total abdominal hysterectomy  1966    fibroids   . Umbilical hernia repair  1960  . L-s bal surgery hemorrhoids   1984 & 1986  . Cholecystectomy  1970  . Appendectomy  1958  . Tonsillectomy    . Partial nephrectomy      right  . Upper gastrointestinal endoscopy  02/11/2009  . Upper gastrointestinal endoscopy  07/02/08  . Colonscopy    . Eye surgery      Cataract with Lens Bil  . Hemodialysis catheter  09/2011    Right Chest  . Cardiac catheterization      2002- Dr Martinique - No blockage  . Adrenalectomy      right  . [place] thora/paracentesis  10/12/2011    1854ml  removed  . Av fistula placement  12/21/2011    Procedure: ARTERIOVENOUS (AV) FISTULA CREATION;  Surgeon: Mal Misty, MD;  Location: [facility] OR;  Service: Vascular;  Laterality: Left;  Creation Left brachial cephalic arteriovenous fistula  . Kidney      rt removed  . Back surgery    . Nephrectomy      right  . Insertion of dialysis catheter N/A 04/30/2013    Procedure: INSERTION OF DIALYSIS CATHETER; ULTRASOUND GUIDED;  Surgeon: Mal Misty, MD;  Location: [place];  Service: Vascular;  Laterality: N/A;  . Fistulogram Left 05/30/2012    Procedure: FISTULOGRAM;  Surgeon: Elam Dutch, MD;  Location: [facility] CATH LAB;  Service: Cardiovascular;  Laterality: Left;  . Fistulogram Left 06/22/2013    Procedure: FISTULOGRAM;  Surgeon: Angelia Mould, MD;  Location: [facility] CATH LAB;  Service: Cardiovascular;  Laterality: Left;    Family History  Problem Relation Age of Onset  . Heart disease Father   . Diabetes Father   . Stroke Father   . Diabetes Sister   . Healthy Sister   . Healthy Son   . Healthy Son   . Healthy Daughter   . Anesthesia problems Neg Hx      History  Substance Use Topics  . Smoking status: Never Smoker   . Smokeless tobacco: Never Used  . Alcohol Use: No    Prior to Admission medications   Medication Sig Start Date End Date Taking? Authorizing Provider  albuterol (PROVENTIL) (2.5 MG/3ML) 0.083% nebulizer solution Take 2.5 mg by nebulization See admin instructions. Inhale 1 vial 4 times daily and as needed for  shortness of breath or wheezing   Yes Historical Provider, MD  Cholecalciferol (VITAMIN D) 2000 UNITS tablet Take 2,000 Units by mouth daily.   Yes Historical Provider, MD  Fluticasone-Salmeterol (ADVAIR DISKUS IN) Inhale 1 puff into the lungs 2 (two) times daily.   Yes Historical Provider, MD  FOLIC ACID PO Take 1 tablet by mouth daily.   Yes Historical Provider, MD  LANTUS 100 UNIT/ML injection INJECT 20 TO 25 UNITS SUBCUTANEOUSLY AT BEDTIME 08/16/14  Yes Mary-Margaret Hassell Done, FNP  Multiple Vitamins-Minerals (PRESERVISION AREDS PO) Take 1 tablet by mouth 2 (two) times daily.   Yes Historical Provider, MD  Omega-3 Fatty Acids (FISH OIL) 1000 MG CAPS Take 1,000 mg by mouth daily.   Yes Historical Provider, MD  omeprazole (PRILOSEC OTC) 20 MG tablet Take 20 mg by mouth daily.   Yes Historical Provider, MD  sevelamer carbonate (RENVELA) 800 MG tablet Take 800-2,400 mg by mouth See admin instructions. Take 3 tablets (2400 mg) with meals and 1 tablet (800 mg) with snacks (Do not take if snack is fruit)   Yes Historical Provider, MD  albuterol (PROVENTIL HFA;VENTOLIN HFA) 108 (90 BASE) MCG/ACT inhaler Inhale 1 puff into the lungs every 4 (four) hours as needed for wheezing or shortness of breath.    Historical Provider, MD  allopurinol (ZYLOPRIM) 300 MG tablet TAKE 1 TABLET BY MOUTH EVERY DAY 07/20/14   Chipper Herb, MD  ALPRAZolam Duanne Moron) 0.5 MG tablet Take 0.5 mg by mouth 3 (three) times daily as needed for anxiety.    Historical Provider, MD  amLODipine (NORVASC) 10 MG tablet TAKE 1 TABLET DAILY 01/22/14   Chipper Herb, MD  amLODipine (NORVASC) 5 MG tablet  08/18/14   Historical Provider, MD  carvedilol (COREG) 25 MG tablet Take 0.5 tablets (12.5 mg total) by mouth 2 (two) times daily with a meal. 10/12/13   Vernie Shanks, MD  cefUROXime (CEFTIN) 500 MG tablet  08/09/14   Historical Provider, MD  Cholecalciferol (VITAMIN D) 2000 UNITS CAPS Take 2,000 Units by mouth every evening.     Historical  Provider, MD  cyclobenzaprine (FLEXERIL) 5 MG tablet TAKE 1 TABLET BY MOUTH AT BEDTIME AS NEEDED MUSCLE SPASMS 06/18/14   Mary-Margaret Hassell Done, FNP  glimepiride (AMARYL) 2 MG tablet TAKE 1 TABLET BY MOUTH EVERY DAY BEFORE BREAKFAST 08/16/14   Mary-Margaret Hassell Done, FNP  levothyroxine (SYNTHROID, LEVOTHROID) 175 MCG tablet Take  1 tablet (175 mcg total) by mouth daily. 08/02/14   Chipper Herb, MD  predniSONE (DELTASONE) 20 MG tablet  08/23/14   Historical Provider, MD  RENVELA 800 MG tablet  08/18/14   Historical Provider, MD    Current Facility-Administered Medications  Medication Dose Route Frequency Provider Last Rate Last Dose  . 0.9 %  sodium chloride infusion  250 mL Intravenous PRN Mariea Clonts, MD 10 mL/hr at 08/31/2014 0216 250 mL at 08/27/2014 0216  . albuterol (PROVENTIL) (2.5 MG/3ML) 0.083% nebulizer solution 2.5 mg  2.5 mg Nebulization Q4H PRN Brand Males, MD      . allopurinol (ZYLOPRIM) tablet 300 mg  300 mg Oral Daily Mariea Clonts, MD      . ALPRAZolam Duanne Moron) tablet 0.25 mg  0.25 mg Oral Q8H PRN Mariea Clonts, MD      . amLODipine (NORVASC) tablet 10 mg  10 mg Oral Daily Mariea Clonts, MD      . carvedilol (COREG) tablet 12.5 mg  12.5 mg Oral BID WC Mariea Clonts, MD   12.5 mg at 08/30/2014 2141  . cholecalciferol (VITAMIN D) tablet 2,000 Units  2,000 Units Oral QHS Brand Males, MD   2,000 Units at 09/17/2014 2142  . cyclobenzaprine (FLEXERIL) tablet 5 mg  5 mg Oral QHS PRN Mariea Clonts, MD      . dextrose 50 % solution           . insulin aspart (novoLOG) injection 0-15 Units  0-15 Units Subcutaneous 6 times per day Raylene Miyamoto, MD   3 Units at 09/15/2014 0403  . insulin glargine (LANTUS) injection 10 Units  10 Units Subcutaneous QHS Mariea Clonts, MD   10 Units at 09/21/2014 2137  . levothyroxine (SYNTHROID, LEVOTHROID) tablet 175 mcg  175 mcg Oral QAC breakfast Mariea Clonts, MD      . pantoprazole (PROTONIX) 80 mg in sodium chloride 0.9 % 250  mL (0.32 mg/mL) infusion  8 mg/hr Intravenous Continuous Mariea Clonts, MD 25 mL/hr at 08/28/14 0746 8 mg/hr at 08/28/14 0746  . [START ON 08/31/2014] pantoprazole (PROTONIX) injection 40 mg  40 mg Intravenous Q12H Mariea Clonts, MD        Allergies as of 09/16/2014 - Review Complete 09/01/2014  Allergen Reaction Noted  . Aspirin adult low [aspirin] Other (See Comments) 01/12/2011  . Nsaids Other (See Comments) 01/12/2011  . Sulfa antibiotics Rash 01/12/2011    Review of Systems:    All systems reviewed and negative except where noted in HPI.    Physical Exam:  Vital signs in last 24 hours: Temp:  [97.5 F (36.4 C)-98 F (36.7 C)] 97.5 F (36.4 C) (01/02 0800) Pulse Rate:  [92-116] 92 (01/02 0700) Resp:  [13-23] 14 (01/02 0700) BP: (108-168)/(42-64) 108/45 mmHg (01/02 0700) SpO2:  [97 %-100 %] 100 % (01/02 0700) Weight:  [203 lb 8 oz (92.307 kg)] 203 lb 8 oz (92.307 kg) (01/02 0600) Last BM Date: 09/13/2014 General:   Pleasant morbidly obese white female in NAD Head:  Normocephalic and atraumatic. Eyes:   No icterus.   Conjunctiva pink. Ears:  Normal auditory acuity. Neck:  Supple; no masses felt Lungs:  Respirations even and unlabored. Expiratory wheezes bilateral bases.  Heart:  Regular rate and rhythm;  murmur heard. Abdomen:  Soft, obese.  Normal bowel sounds. No appreciable masses or hepatomegaly.  Rectal:  Black stool.  Msk:  Symmetrical without gross deformities.  Extremities:  Trace BLE  edema. Neurologic:  Alert and  oriented x4;  grossly normal neurologically. Skin:  Intact without significant lesions or rashes. Cervical Nodes:  No significant cervical adenopathy. Psych:  Alert and cooperative. Normal affect.  LAB RESULTS:  Recent Labs  09/09/2014 0017  WBC 25.5*  HGB 8.5*  HCT 27.0*  PLT 54*   BMET  Recent Labs  09/01/2014 0017 09/16/2014 0407  NA 136 139  K 4.6 4.5  CL 94* 96  CO2 23 24  GLUCOSE 340* 95  BUN 97* 105*  CREATININE 4.66* 4.80*    CALCIUM 8.1* 8.1*   LFT  Recent Labs  09/12/2014 0017  PROT 4.3*  ALBUMIN 2.4*  AST 27  ALT 30  ALKPHOS 94  BILITOT 0.6    PREVIOUS ENDOSCOPIES:            remote EGD / several remote colonoscopies (Dr. Amedeo Plenty)   Impression / Plan:   19. 81 year old female with multiple medical problems including ESRD on HD. She presents with upper GI bleeding. Rule out PUD, AVMs, rule out variceal bleed / portal gastropathy, etc..Marland KitchenFor further evaluation patient will be scheduled for EGD. Continue PPI drip. Cirrhosis listed in [place] and she does have some labs findings supportive of cirrhosis. Octreotide hasn't been started. She is for EGD this am so will see what that brings.   2. Leukocytosis, ?etiology. She was taking Prednisone at home which cold be contributing.   3. Pancytopenia / coagulopathy / hypoalbuminemia. Patient apparently has a history of cirrhosis.   4. Acute on chronic anemia. Last CBC was YESTERDAY at midnight. Patient wasn't even here at that time so not sure about those results. Nurse just called for the CBC results from 4am and hgb currently 8.0.    Thanks   LOS: 1 day   Tye Savoy  09/21/2014, 9:03 AM    [facility] Attending  I have also seen and assessed the patient and agree with the above note. UGI bleed in a cirrhotic patient on HD for ESRD. The risks and benefits as well as alternatives of endoscopic procedure(s) have been discussed and reviewed. All questions answered. The patient agrees to proceed.  [name], MD, [facility] [phone] (pager) 09/15/2014 11:25 AM

## 2014-08-29 LAB — GLUCOSE, CAPILLARY
GLUCOSE-CAPILLARY: 120 mg/dL — AB (ref 70–99)
GLUCOSE-CAPILLARY: 170 mg/dL — AB (ref 70–99)
GLUCOSE-CAPILLARY: 142 mg/dL — AB (ref 70–99)
GLUCOSE-CAPILLARY: 180 mg/dL — AB (ref 70–99)
Glucose-Capillary: 113 mg/dL — ABNORMAL HIGH (ref 70–99)
Glucose-Capillary: 132 mg/dL — ABNORMAL HIGH (ref 70–99)

## 2014-08-29 LAB — RENAL FUNCTION PANEL
Albumin: 2.4 g/dL — ABNORMAL LOW (ref 3.5–5.2)
Anion gap: 15 (ref 5–15)
BUN: 135 mg/dL — ABNORMAL HIGH (ref 6–23)
CALCIUM: 7 mg/dL — AB (ref 8.4–10.5)
CHLORIDE: 97 meq/L (ref 96–112)
CO2: 25 mmol/L (ref 19–32)
Creatinine, Ser: 6.34 mg/dL — ABNORMAL HIGH (ref 0.50–1.10)
GFR calc non Af Amer: 5 mL/min — ABNORMAL LOW (ref >90)
GFR, EST AFRICAN AMERICAN: 6 mL/min — AB (ref >90)
GLUCOSE: 168 mg/dL — AB (ref 70–99)
POTASSIUM: 4.5 mmol/L (ref 3.5–5.1)
Phosphorus: 8.7 mg/dL — ABNORMAL HIGH (ref 2.3–4.6)
SODIUM: 137 mmol/L (ref 135–145)

## 2014-08-29 LAB — CBC
HEMATOCRIT: 23.9 % — AB (ref 36.0–46.0)
Hemoglobin: 7.7 g/dL — ABNORMAL LOW (ref 12.0–15.0)
MCH: 32.5 pg (ref 26.0–34.0)
MCHC: 32.2 g/dL (ref 30.0–36.0)
MCV: 100.8 fL — ABNORMAL HIGH (ref 78.0–100.0)
Platelets: 56 10*3/uL — ABNORMAL LOW (ref 150–400)
RBC: 2.37 MIL/uL — AB (ref 3.87–5.11)
RDW: 23.3 % — AB (ref 11.5–15.5)
WBC: 20 10*3/uL — ABNORMAL HIGH (ref 4.0–10.5)

## 2014-08-29 NOTE — Progress Notes (Signed)
    Progress Note   Subjective  doesnt' feel well but no specifics. SCDs bothering her   Objective   Vital signs in last 24 hours: Temp:  [97.3 F (36.3 C)-97.7 F (36.5 C)] 97.3 F (36.3 C) (01/03 1110) Pulse Rate:  [68-93] 70 (01/03 1110) Resp:  [12-23] 13 (01/03 1110) BP: (82-126)/(35-76) 100/37 mmHg (01/03 1110) SpO2:  [98 %-100 %] 100 % (01/03 1110) Weight:  [198 lb 13.7 oz (90.2 kg)] 198 lb 13.7 oz (90.2 kg) (01/03 0600) Last BM Date: 08/27/2014 General:    Pleasant obese white female in NAD Abdomen:  Soft, nontender and nondistended. Normal bowel sounds. Neurologic:  Alert and oriented,  grossly normal neurologically. Psych:  Cooperative. .  Lab Results:  Recent Labs  09/10/2014 0407 09/24/2014 1207 08/29/14 0830  WBC 24.4* 22.3* 20.0*  HGB 8.3* 7.7* 7.7*  HCT 24.8* 23.7* 23.9*  PLT 62* 56* 56*    Recent Labs  08/29/2014 0017 08/30/2014 0807  LABPROT 19.7* 18.5*  INR 1.65* 1.53*    Studies/Results:  EGD 09/26/2014 ENDOSCOPIC IMPRESSION: 1) 3 columns of small esophageal varices with red wale signs in distal esophagus - 3 bands placed with good results 2) Patchy mosaic mucosal change in proximal stomach with some red spots - mild portal gastropathy 3) Otherwise normal EGD  RECOMMENDATIONS: 1) Advance diet 2) po PPI 3) IV ceftriaxone infection prophylaxis 4) hold off on octreotide 5) is on carvedilol - continue 6) consider repeating an EGD in about 1 month for reassessment and possible repeat banding (could go back to Dr. Laural Golden vs see me   Assessment / Plan:    87. 81 year old female with variceal bleed, s/p EGD with banding yesterday. See report above. Continue antibiotic for SBP prevention (5 days should suffice). Continue daily PPI.  Repeat EGD in one month. She hasn't seen Dr. Laural Golden in a long time, wants to follow up with [place]. I will have our office call her next week to arrange for repeat EGD in 4-5 weeks.    2. Cirrhosis, probably NASH. We can follow  outpatient   2. Leukocytosis, ?etiology. She was taking Prednisone at home which cold be contributing.WBC slightly better today 22 >>>20.  Likely meds or stress reaction/demargination  4. Acute on chronic anemia. Hgb stable at 7.7 (one 1 unit of blood at outside facility).     LOS: 2 days   Sara [name]  08/29/2014, 11:56 AM   [facility] Attending  I have also seen and assessed the patient and agree with the above note.  Sara Mayer, MD, [facility] [phone] (pager) 08/29/2014 3:50 PM

## 2014-08-29 NOTE — Progress Notes (Addendum)
Admit: 09/08/2014 LOS: 2  29F ESRD MWF at [facility] admit with UGIB 2/2 Esophageal varices, banded 08/28/13  Subjective:  No new events Tolerating PO No futher bleeding after banding   01/02 0701 - 01/03 0700 In: 690 [P.O.:360; I.V.:330] Out: -   Filed Weights   08/30/2014 0600 08/29/14 0600  Weight: 92.307 kg (203 lb 8 oz) 90.2 kg (198 lb 13.7 oz)    Scheduled Meds: . allopurinol  300 mg Oral Daily  . amLODipine  10 mg Oral Daily  . carvedilol  12.5 mg Oral BID WC  . cefTRIAXone (ROCEPHIN)  IV  1 g Intravenous Q24H  . cholecalciferol  2,000 Units Oral QHS  . insulin aspart  0-15 Units Subcutaneous 6 times per day  . insulin glargine  10 Units Subcutaneous QHS  . levothyroxine  175 mcg Oral QAC breakfast  . pantoprazole  40 mg Oral QAC breakfast  . sodium chloride  10-40 mL Intracatheter Q12H   Continuous Infusions:  PRN Meds:.sodium chloride, acetaminophen, albuterol, ALPRAZolam, cyclobenzaprine, sodium chloride  Current Labs: reviewed    Physical Exam:  Blood pressure 92/49, pulse 74, temperature 97.7 F (36.5 C), temperature source Oral, resp. rate 13, weight 90.2 kg (198 lb 13.7 oz), SpO2 100 %. GEN: NAD, obese, in bed lying flat  ENT: NCAT EYES: EOMI CV: RRR, no rub PULM: CTAB, nl WOB ABD: obese, s/nt/nd SKIN: no LEE EXT:No LEE VASC: LUE AVF + B/T  Outpt HD Orders Unit: [facility] Days: MWF Time: 3h Dialyzer: [place] Polyflux EDW: 89.0kg K/Ca: 2/2.5 Access: AVF Needle Size: 15g BFR/DFR: 300/600 UF Proflie: n/a VDRA: Hectorol 9qTx EPO: [id number] IV Fe: 50mg  Venofer qWk Heparin: 1000 units bolus, then 592mL infusion/hr  A/P 1. ESRD 1. No need for HD today, tentative plan for 08/30/13, follow daily, labs pending this AM 2. Will obtain outpt records -- requested 08/28/13 3. K/HCO3 acceptable 2. UGIB / ABLA 1. Will review outpt ESA management when available 2. Transfusion per GI / CCM 3. Cirrhosis 1. Per GI 4. 2HPTH / Hyperphosphatemia 1. Resume  binder when eating 2. Need outpt records 5. HTN/Vol:  1. home meds amlodipine and carvedilol 2. BP soft this AM, hold amlodipine for now 3. Vol status appears stable currently  Pearson Grippe MD 08/29/2014, 7:52 AM   Recent Labs Lab 09/04/2014 0017 09/21/2014 0407  NA 136 139  K 4.6 4.5  CL 94* 96  CO2 23 24  GLUCOSE 340* 95  BUN 97* 105*  CREATININE 4.66* 4.80*  CALCIUM 8.1* 8.1*  PHOS 8.3* 8.2*    Recent Labs Lab 09/23/2014 0017 09/18/2014 0407 09/18/2014 1207  WBC 25.5* 24.4* 22.3*  HGB 8.5* 8.3* 7.7*  HCT 27.0* 24.8* 23.7*  MCV 102.0* 101.6* 101.7*  PLT 54* 62* 56*

## 2014-08-30 ENCOUNTER — Encounter (HOSPITAL_COMMUNITY): Payer: Self-pay | Admitting: Internal Medicine

## 2014-08-30 DIAGNOSIS — N186 End stage renal disease: Secondary | ICD-10-CM

## 2014-08-30 DIAGNOSIS — K7460 Unspecified cirrhosis of liver: Secondary | ICD-10-CM

## 2014-08-30 LAB — RENAL FUNCTION PANEL
Albumin: 2.5 g/dL — ABNORMAL LOW (ref 3.5–5.2)
Anion gap: 16 — ABNORMAL HIGH (ref 5–15)
BUN: 156 mg/dL — AB (ref 6–23)
CO2: 23 mmol/L (ref 19–32)
Calcium: 6.4 mg/dL — CL (ref 8.4–10.5)
Chloride: 97 mEq/L (ref 96–112)
Creatinine, Ser: 8.35 mg/dL — ABNORMAL HIGH (ref 0.50–1.10)
GFR calc Af Amer: 4 mL/min — ABNORMAL LOW (ref >90)
GFR, EST NON AFRICAN AMERICAN: 4 mL/min — AB (ref >90)
Glucose, Bld: 189 mg/dL — ABNORMAL HIGH (ref 70–99)
PHOSPHORUS: 10.8 mg/dL — AB (ref 2.3–4.6)
Potassium: 5.3 mmol/L — ABNORMAL HIGH (ref 3.5–5.1)
Sodium: 136 mmol/L (ref 135–145)

## 2014-08-30 LAB — CBC
HEMATOCRIT: 25.2 % — AB (ref 36.0–46.0)
HEMOGLOBIN: 8 g/dL — AB (ref 12.0–15.0)
MCH: 33.1 pg (ref 26.0–34.0)
MCHC: 31.7 g/dL (ref 30.0–36.0)
MCV: 104.1 fL — ABNORMAL HIGH (ref 78.0–100.0)
Platelets: 57 10*3/uL — ABNORMAL LOW (ref 150–400)
RBC: 2.42 MIL/uL — ABNORMAL LOW (ref 3.87–5.11)
RDW: 22.7 % — AB (ref 11.5–15.5)
WBC: 21.5 10*3/uL — ABNORMAL HIGH (ref 4.0–10.5)

## 2014-08-30 LAB — GLUCOSE, CAPILLARY
GLUCOSE-CAPILLARY: 76 mg/dL (ref 70–99)
GLUCOSE-CAPILLARY: 88 mg/dL (ref 70–99)
Glucose-Capillary: 151 mg/dL — ABNORMAL HIGH (ref 70–99)

## 2014-08-30 LAB — PREPARE RBC (CROSSMATCH)

## 2014-08-30 MED ORDER — SODIUM CHLORIDE 0.9 % IV SOLN: Freq: Once | INTRAVENOUS | Status: DC

## 2014-08-30 MED ORDER — ACETAMINOPHEN 325 MG PO TABS
ORAL_TABLET | ORAL | Status: AC
Start: 2014-08-30 — End: 2014-08-30
  Filled 2014-08-30: qty 2

## 2014-08-30 MED ORDER — LIDOCAINE HCL (PF) 1 % IJ SOLN: 5.0000 mL | INTRAMUSCULAR | Status: DC | PRN

## 2014-08-30 MED ORDER — NEPRO/CARBSTEADY PO LIQD: 237.0000 mL | ORAL | Status: DC | PRN

## 2014-08-30 MED ORDER — LIDOCAINE-PRILOCAINE 2.5-2.5 % EX CREA: 1.0000 "application " | TOPICAL_CREAM | CUTANEOUS | Status: DC | PRN

## 2014-08-30 MED ORDER — SODIUM CHLORIDE 0.9 % IV SOLN: 100.0000 mL | INTRAVENOUS | Status: DC | PRN

## 2014-08-30 MED ORDER — GLIMEPIRIDE 1 MG PO TABS
1.0000 mg | ORAL_TABLET | Freq: Every day | ORAL | Status: DC
  Filled 2014-08-30 (×2): qty 1

## 2014-08-30 MED ORDER — ALBUTEROL SULFATE (2.5 MG/3ML) 0.083% IN NEBU
INHALATION_SOLUTION | RESPIRATORY_TRACT | Status: AC
  Administered 2014-08-30: 2.5 mg
  Filled 2014-08-30: qty 3

## 2014-08-30 MED ORDER — CALCIUM CARBONATE 1250 (500 CA) MG PO TABS
1000.0000 mg | ORAL_TABLET | Freq: Every day | ORAL | Status: DC
  Administered 2014-08-30 – 2014-09-02 (×2): 1000 mg via ORAL
  Filled 2014-08-30 (×5): qty 2

## 2014-08-30 MED ORDER — INSULIN ASPART 100 UNIT/ML ~~LOC~~ SOLN
0.0000 [IU] | Freq: Three times a day (TID) | SUBCUTANEOUS | Status: DC
  Administered 2014-08-30: 2 [IU] via SUBCUTANEOUS

## 2014-08-30 MED ORDER — PENTAFLUOROPROP-TETRAFLUOROETH EX AERO: 1.0000 "application " | INHALATION_SPRAY | CUTANEOUS | Status: DC | PRN

## 2014-08-30 MED ORDER — HEPARIN SODIUM (PORCINE) 1000 UNIT/ML DIALYSIS: 1000.0000 [IU] | INTRAMUSCULAR | Status: DC | PRN

## 2014-08-30 NOTE — Progress Notes (Signed)
Patient ID: Sara [name], female   DOB: 1925/01/16, 81 y.o.   MRN: [id number]  [facility] Progress Note   Assessment/ Plan:   1. Acute blood loss anemia/GI bleed from esophageal varices status post EGD/banding: Currently denies any ongoing bleeding and has had an uneventful night. Ongoing management by gastroenterology and follow-up EGD planned in one month for possible repeat banding (she has cirrhosis likely secondary to NASH). Will order 1 unit packed red cell transfusion while in dialysis 2.ESRD: Plans for hemodialysis today, heparin free. Volume does not appear to be a major problem however BUN elevated secondary to GI bleed. 3. CKD-MBD: Anticipate that that'll be started in the near future, will reconcile phosphorus binders 5. Nutrition: Diet to be restarted/advanced per gastroenterology 6. Hypertension: Hemodynamically stable, blood pressures appear to be acceptable.  Subjective:   Reports for her comfortable night, denies any complaints. Denies any further GI bleed.    Objective:   BP 129/55 mmHg  Pulse 72  Temp(Src) 97.2 F (36.2 C) (Oral)  Resp 18  Ht 5\' 4"  (1.626 m)  Wt 90.7 kg (199 lb 15.3 oz)  BMI 34.31 kg/m2  SpO2 100%  Physical Exam: Gen: Compared to be resting in bed, hard of hearing CVS: Pulse regular in rate and rhythm, S1 and S2 normal Resp: Clear to auscultation, no rales/rhonchi Abd: Soft, flat, nontender and bowel sounds normal Ext: No lower extremity edema  Labs: BMET  Recent Labs Lab 09/19/2014 0017 09/13/2014 0407 08/29/14 0810  NA 136 139 137  K 4.6 4.5 4.5  CL 94* 96 97  CO2 23 24 25   GLUCOSE 340* 95 168*  BUN 97* 105* 135*  CREATININE 4.66* 4.80* 6.34*  CALCIUM 8.1* 8.1* 7.0*  PHOS 8.3* 8.2* 8.7*   CBC  Recent Labs Lab 08/29/2014 0017 09/16/2014 0407 09/13/2014 1207 08/29/14 0830  WBC 25.5* 24.4* 22.3* 20.0*  HGB 8.5* 8.3* 7.7* 7.7*  HCT 27.0* 24.8* 23.7* 23.9*  MCV 102.0* 101.6* 101.7* 100.8*  PLT 54* 62* 56* 56*    Medications:    . allopurinol  300 mg Oral Daily  . carvedilol  12.5 mg Oral BID WC  . cefTRIAXone (ROCEPHIN)  IV  1 g Intravenous Q24H  . cholecalciferol  2,000 Units Oral QHS  . insulin aspart  0-15 Units Subcutaneous 6 times per day  . insulin glargine  10 Units Subcutaneous QHS  . levothyroxine  175 mcg Oral QAC breakfast  . pantoprazole  40 mg Oral QAC breakfast  . sodium chloride  10-40 mL Intracatheter Q12H   Elmarie Shiley, MD 08/30/2014, 7:30 AM

## 2014-08-30 NOTE — Progress Notes (Signed)
Daily Rounding Note  08/30/2014, 8:22 AM  LOS: 3 days   SUBJECTIVE:       Small black stool yesterday, another this AM.  No pain or nausea.  Appetite marginal.  Feels  SOB.  Says on Wednesday last week, while at [facility], they "stuck a needle" in her mid back and removed 1.5 liters of fluid.  Sounds like a thoracentesis.  Nothing mentioned in records.  Also says they have determined  Thyromegaly by ultrasound and the gland may be causing stridor.   OBJECTIVE:         Vital signs in last 24 hours:    Temp:  [97.2 F (36.2 C)-98.1 F (36.7 C)] 97.4 F (36.3 C) (01/04 0743) Pulse Rate:  [68-82] 72 (01/04 0700) Resp:  [12-22] 18 (01/04 0700) BP: (89-136)/(29-64) 129/55 mmHg (01/04 0700) SpO2:  [98 %-100 %] 100 % (01/04 0700) Weight:  [199 lb 15.3 oz (90.7 kg)] 199 lb 15.3 oz (90.7 kg) (01/04 0600) Last BM Date: 09/21/2014 Filed Weights   09/06/2014 0600 08/29/14 0600 08/30/14 0600  Weight: 203 lb 8 oz (92.307 kg) 198 lb 13.7 oz (90.2 kg) 199 lb 15.3 oz (90.7 kg)   General: looks chronically ill, pale.     Heart: RRR Chest: clear lungs but roaring sound of stridor.  (chronic since 07/27/14 per pt) Abdomen: soft, NT, ND.  Active BS  Extremities: No CCE Neuro/Psych:  Pleasant, appropriate.  Oriented x 3.   Intake/Output from previous day: 01/03 0701 - 01/04 0700 In: 33 [P.O.:230; I.V.:230; IV Piggyback:50] Out: -   Intake/Output this shift:    Lab Results:  Recent Labs  09/07/2014 0407 09/12/2014 1207 08/29/14 0830  WBC 24.4* 22.3* 20.0*  HGB 8.3* 7.7* 7.7*  HCT 24.8* 23.7* 23.9*  PLT 62* 56* 56*   BMET  Recent Labs  08/27/2014 0407 08/29/14 0810  NA 139 137  K 4.5 4.5  CL 96 97  CO2 24 25  GLUCOSE 95 168*  BUN 105* 135*  CREATININE 4.80* 6.34*  CALCIUM 8.1* 7.0*   LFT  Recent Labs  08/29/14 0810  ALBUMIN 2.4*   PT/INR  Recent Labs  09/01/2014 0807  LABPROT 18.5*  INR 1.53*   Hepatitis Panel No  results for input(s): HEPBSAG, HCVAB, HEPAIGM, HEPBIGM in the last 72 hours.  Studies/Results: No results found.   Scheduled Meds: . sodium chloride   Intravenous Once  . allopurinol  300 mg Oral Daily  . carvedilol  12.5 mg Oral BID WC  . cefTRIAXone (ROCEPHIN)  IV  1 g Intravenous Q24H  . cholecalciferol  2,000 Units Oral QHS  . insulin aspart  0-15 Units Subcutaneous 6 times per day  . insulin glargine  10 Units Subcutaneous QHS  . levothyroxine  175 mcg Oral QAC breakfast  . pantoprazole  40 mg Oral QAC breakfast  . sodium chloride  10-40 mL Intracatheter Q12H   Continuous Infusions:  PRN Meds:.sodium chloride, acetaminophen, albuterol, ALPRAZolam, cyclobenzaprine, sodium chloride   ASSESMENT:   *  UGI bleed.  Melena, hematemesis.  S/p 08/28/13 EGD with banding of esoph varices; portal gastropathy noted. Suspect dark stool today is old blood. On Coreg PTA. Day 3 Rocephin prophylaxis.   *  ABL anemia on top of chronic anemia.  S/p PRBC x 2, one at [facility], one at [facility]. Hgb stable. One more PRBC ordered for today at HD.   *  Coagulopathy.  S/p 2 mg IV Vit K 08/28/13  *  ESRD.  On HD MWF.   *  Type 2 DM.   *  Thrombocytopenia.   *  Leukocytosis.  No fever.    *  Stridor.  ? Secondary to thyromegaly?  ? Recent thoracentesis last week?   PLAN   *  Stop Rocephin at discharge.   *  Will set up GI ROV with APP vs Silvano Rusk, MD.  Will sign off.     Sara [name]  08/30/2014, 8:22 AM Pager: [phone]     Attending physician's note   I have taken an interval history, reviewed the chart and examined the patient. I agree with the Advanced Practitioner's note, impression and recommendations. No recurrent bleeding. Agree with transfusions to keep Hb > 8. Continue Coreg. GI signing off. Outpatient GI follow up as above.   Pricilla Riffle. Fuller Plan, MD [facility]

## 2014-08-30 NOTE — Progress Notes (Signed)
Utilization review completed. Earnest Thalman, RN, BSN. 

## 2014-08-30 NOTE — Evaluation (Signed)
Physical Therapy Evaluation Patient Details Name: EDMUND RICK MRN: [id number] DOB: 06-04-1925 Today's Date: 08/30/2014   History of Present Illness  81 yo female from [place] with dyspnea, melena, hematemesis.  Clinical Impression  Pt is pleasant with decreased activity tolerance. Pt with sats 97% on RA at rest with productive cough with transfer to EOB and decreased sats to 85% EOB requiring breathing treatment to recover to 96% and remain on RA throughout rest of mobility. Pt with generalized weakness and decreased mobility at baseline who will benefit from acute therapy to maximize mobility, function, gait and strength to decrease burden of care and increase independence. Pt returned to bed for HD today and will continue to follow.     Follow Up Recommendations SNF;Supervision/Assistance - 24 hour    Equipment Recommendations  None recommended by PT    Recommendations for Other Services       Precautions / Restrictions Precautions Precautions: Fall      Mobility  Bed Mobility Overal bed mobility: Needs Assistance Bed Mobility: Supine to Sit;Sit to Supine     Supine to sit: Min assist Sit to supine: Min assist   General bed mobility comments: cues for hand placement and sequence with assist to fully elevate trunk with HOB 30degrees and min assist to elevate legs to bed  Transfers Overall transfer level: Needs assistance   Transfers: Sit to/from Stand Sit to Stand: Min assist;From elevated surface         General transfer comment: cues for hand placement and sequence x 2 from bed able to stand grossly 45 sec prior to fatigue but unable to achieve full trunk and hip extension in standing  Ambulation/Gait Ambulation/Gait assistance:  (unable due to fatigue and weakness at this time)              Stairs            Wheelchair Mobility    Modified Rankin (Stroke Patients Only)       Balance Overall balance assessment: Needs assistance    Sitting balance-Leahy Scale: Fair Sitting balance - Comments: minguard wth cues for posture to maintain EOB Postural control: Posterior lean   Standing balance-Leahy Scale: Poor                               Pertinent Vitals/Pain Pain Assessment: No/denies pain  HR 95 sats 85-95% on RA BP 141/69    Home Living Family/patient expects to be discharged to:: Assisted living               Home Equipment: Walker - 2 wheels;Bedside commode;Wheelchair - manual      Prior Function Level of Independence: Needs assistance   Gait / Transfers Assistance Needed: mod I with ambulation with RW, transfers   ADL's / Homemaking Assistance Needed: mod I with bathing and dressing        Hand Dominance        Extremity/Trunk Assessment   Upper Extremity Assessment: Generalized weakness           Lower Extremity Assessment: Generalized weakness      Cervical / Trunk Assessment: Kyphotic  Communication   Communication: No difficulties  Cognition Arousal/Alertness: Awake/alert Behavior During Therapy: WFL for tasks assessed/performed Overall Cognitive Status: Within Functional Limits for tasks assessed                      General Comments  Exercises        Assessment/Plan    PT Assessment Patient needs continued PT services  PT Diagnosis Difficulty walking;Generalized weakness   PT Problem List Decreased strength;Decreased activity tolerance;Decreased balance;Decreased mobility;Cardiopulmonary status limiting activity;Obesity  PT Treatment Interventions DME instruction;Gait training;Functional mobility training;Therapeutic activities;Therapeutic exercise;Balance training;Patient/family education   PT Goals (Current goals can be found in the Care Plan section) Acute Rehab PT Goals Patient Stated Goal: be able to walk and breathe PT Goal Formulation: With patient Time For Goal Achievement: 09/13/14 Potential to Achieve Goals: Fair     Frequency Min 3X/week   Barriers to discharge Decreased caregiver support      Co-evaluation               End of Session Equipment Utilized During Treatment: Gait belt;Oxygen Activity Tolerance: Patient limited by fatigue Patient left: in bed;with call bell/phone within reach;with nursing/sitter in room;with family/visitor present Nurse Communication: Mobility status;Precautions         Time: 4469-5072 PT Time Calculation (min) (ACUTE ONLY): 26 min   Charges:   PT Evaluation $Initial PT Evaluation Tier I: 1 Procedure PT Treatments $Therapeutic Activity: 8-22 mins   PT G Codes:        Melford Aase 08/30/2014, 12:44 PM Elwyn Reach, [place]

## 2014-08-30 NOTE — Procedures (Signed)
Patient seen on Hemodialysis. QB 300, UF goal 1.5L Treatment adjusted as needed.  Elmarie Shiley MD [facility]. Office # [phone] Pager # [phone] 3:41 PM

## 2014-08-30 NOTE — Progress Notes (Signed)
PULMONARY / CRITICAL CARE MEDICINE   Name: Sara [name] MRN: [id number] DOB: [birth year]    ADMISSION DATE:  09/13/2014  CHIEF COMPLAINT:  Short of breath  INITIAL PRESENTATION:  81 yo female from [place] with dyspnea, melena, hematemesis.  STUDIES:  1/02 EGD >> esophageal varices with red wale signs s/p banding, mild portal gastropathy  SIGNIFICANT EVENTS: 1/01 transfer to [facility], GI/renal consulted, 1 unit PRBC transfusion 1/04 1 unit PRBC in HD, to SDU  SUBJECTIVE:  Intermittently gets wheeze and stridor.  For HD later today.  VITAL SIGNS: Temp:  [97.2 F (36.2 C)-98.1 F (36.7 C)] 97.4 F (36.3 C) (01/04 0743) Pulse Rate:  [68-82] 72 (01/04 0700) Resp:  [12-22] 18 (01/04 0700) BP: (89-136)/(29-64) 129/55 mmHg (01/04 0700) SpO2:  [95 %-100 %] 95 % (01/04 0826) Weight:  [199 lb 15.3 oz (90.7 kg)] 199 lb 15.3 oz (90.7 kg) (01/04 0600) INTAKE / OUTPUT:  Intake/Output Summary (Last 24 hours) at 08/30/14 0938 Last data filed at 08/30/14 0700  Gross per 24 hour  Intake    440 ml  Output      0 ml  Net    440 ml    PHYSICAL EXAMINATION: General: pleasant Neuro: alert, normal strength HEENT: mild stridor Cardiovascular: regular, no murmur Lungs: no wheeze Abdomen: soft, non tender Musculoskeletal: 1+ edema Skin: no rashes  LABS:  CBC  Recent Labs Lab 09/12/2014 0407 09/02/2014 1207 08/29/14 0830  WBC 24.4* 22.3* 20.0*  HGB 8.3* 7.7* 7.7*  HCT 24.8* 23.7* 23.9*  PLT 62* 56* 56*   Coag's  Recent Labs Lab 09/18/2014 0017 09/10/2014 0807  APTT 24 27  INR 1.65* 1.53*   BMET  Recent Labs Lab 09/04/2014 0017 09/17/2014 0407 08/29/14 0810  NA 136 139 137  K 4.6 4.5 4.5  CL 94* 96 97  CO2 23 24 25   BUN 97* 105* 135*  CREATININE 4.66* 4.80* 6.34*  GLUCOSE 340* 95 168*   Electrolytes  Recent Labs Lab 09/23/2014 0017 09/05/2014 0407 08/29/14 0810  CALCIUM 8.1* 8.1* 7.0*  MG 1.9 1.8  --   PHOS 8.3* 8.2* 8.7*   Sepsis Markers  Recent Labs Lab  09/01/2014 0016  LATICACIDVEN 3.8*   ABG  Recent Labs Lab 08/31/2014 2344  PHART 7.389  PCO2ART 45.0  PO2ART 30.0*   Liver Enzymes  Recent Labs Lab 09/18/2014 0017 08/29/14 0810  AST 27  --   ALT 30  --   ALKPHOS 94  --   BILITOT 0.6  --   ALBUMIN 2.4* 2.4*   Cardiac Enzymes  Recent Labs Lab 09/22/2014 0017 09/21/2014 0206 09/24/2014 0807  TROPONINI 0.18* 0.18* 0.17*   Glucose  Recent Labs Lab 08/29/14 1144 08/29/14 1616 08/29/14 1941 08/29/14 2314 08/30/14 0354 08/30/14 0740  GLUCAP 142* 113* 180* 132* 76 88    Imaging No results found.   ASSESSMENT / PLAN:  PULMONARY A: Stridor ?from thyromegaly >> no respiratory distress. ?hx of COPD. P:   Monitor clinically PRN BD's Hold outpt advair F/u CXR 1/05 Oxygen to keep SpO2 > 92%  CARDIOVASCULAR Rt femoral CVL 1/01 >> A:  Hx of HTN, HLD, CAD. P:  Continue norvasc, coreg  RENAL A:   Hx of ESRD. P:   HD per renal  GASTROINTESTINAL A:   Upper GI bleed from esophageal varices. 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 Hx of GERD. P:   Renal diet protonix GI signed off 1/04  HEMATOLOGIC A:   Acute blood loss anemia with anemia  of chronic disease. P:  F/u CBC Transfuse 1 unit PRBC in HD on 1/04 per renal SCD's for DVT prevention  INFECTIOUS A:   Post-EGD prophylaxis. P:   Day 2 rocephin >> d/c after dose on 1/04  ENDOCRINE A:   Hx of hypothyroidism. Hx of DM type II with CKD. Hx of gout. P:   Allopurinol Synthroid Amaryl  NEUROLOGIC A:   Hx of RLS. Deconditioning. P:   PT/OT consult  Transfer to SDU.  Keep on PCCM service since likely ready for d/c soon.  Chesley Mires, MD [facility] Pulmonary/Critical Care 08/30/2014, 9:57 AM Pager:  [phone] After 3pm call: [phone]

## 2014-08-31 ENCOUNTER — Inpatient Hospital Stay (HOSPITAL_COMMUNITY): Payer: Medicare Other

## 2014-08-31 DIAGNOSIS — J9620 Acute and chronic respiratory failure, unspecified whether with hypoxia or hypercapnia: Secondary | ICD-10-CM

## 2014-08-31 DIAGNOSIS — J9809 Other diseases of bronchus, not elsewhere classified: Secondary | ICD-10-CM

## 2014-08-31 LAB — RENAL FUNCTION PANEL
ALBUMIN: 2.1 g/dL — AB (ref 3.5–5.2)
Anion gap: 11 (ref 5–15)
BUN: 60 mg/dL — AB (ref 6–23)
CO2: 29 mmol/L (ref 19–32)
CREATININE: 4.75 mg/dL — AB (ref 0.50–1.10)
Calcium: 7.4 mg/dL — ABNORMAL LOW (ref 8.4–10.5)
Chloride: 101 mEq/L (ref 96–112)
GFR calc Af Amer: 9 mL/min — ABNORMAL LOW (ref >90)
GFR calc non Af Amer: 7 mL/min — ABNORMAL LOW (ref >90)
Glucose, Bld: 138 mg/dL — ABNORMAL HIGH (ref 70–99)
Phosphorus: 7.2 mg/dL — ABNORMAL HIGH (ref 2.3–4.6)
Potassium: 4.1 mmol/L (ref 3.5–5.1)
Sodium: 141 mmol/L (ref 135–145)

## 2014-08-31 LAB — TYPE AND SCREEN
ABO/RH(D): O POS
ABO/RH(D): O POS
Antibody Screen: POSITIVE
Antibody Screen: POSITIVE
DAT, IGG: NEGATIVE
Donor AG Type: NEGATIVE
Donor AG Type: NEGATIVE
PT AG TYPE: NEGATIVE
UNIT DIVISION: 0
Unit division: 0

## 2014-08-31 LAB — HEPATITIS B SURFACE ANTIGEN: Hepatitis B Surface Ag: NEGATIVE

## 2014-08-31 LAB — GLUCOSE, CAPILLARY
Glucose-Capillary: 110 mg/dL — ABNORMAL HIGH (ref 70–99)
Glucose-Capillary: 125 mg/dL — ABNORMAL HIGH (ref 70–99)

## 2014-08-31 LAB — CBC
HEMATOCRIT: 25.1 % — AB (ref 36.0–46.0)
HEMOGLOBIN: 7.9 g/dL — AB (ref 12.0–15.0)
MCH: 32.2 pg (ref 26.0–34.0)
MCHC: 31.5 g/dL (ref 30.0–36.0)
MCV: 102.4 fL — ABNORMAL HIGH (ref 78.0–100.0)
Platelets: 36 10*3/uL — ABNORMAL LOW (ref 150–400)
RBC: 2.45 MIL/uL — ABNORMAL LOW (ref 3.87–5.11)
RDW: 23.9 % — ABNORMAL HIGH (ref 11.5–15.5)
WBC: 6.7 10*3/uL (ref 4.0–10.5)

## 2014-08-31 LAB — HEPATITIS B SURFACE ANTIBODY,QUALITATIVE: Hep B S Ab: NEGATIVE

## 2014-08-31 MED ORDER — ALBUTEROL SULFATE (2.5 MG/3ML) 0.083% IN NEBU
2.5000 mg | INHALATION_SOLUTION | RESPIRATORY_TRACT | Status: DC | PRN
  Administered 2014-09-02: 2.5 mg via RESPIRATORY_TRACT
  Filled 2014-08-31 (×2): qty 3

## 2014-08-31 MED ORDER — ACETYLCYSTEINE 20 % IN SOLN
3.0000 mL | Freq: Four times a day (QID) | RESPIRATORY_TRACT | Status: DC
Start: 2014-08-31 — End: 2014-09-01
  Administered 2014-08-31: 4 mL via RESPIRATORY_TRACT
  Administered 2014-08-31 – 2014-09-01 (×2): 3 mL via RESPIRATORY_TRACT
  Filled 2014-08-31 (×4): qty 4

## 2014-08-31 MED ORDER — PANTOPRAZOLE SODIUM 40 MG IV SOLR
40.0000 mg | INTRAVENOUS | Status: DC
  Administered 2014-08-31 – 2014-09-02 (×3): 40 mg via INTRAVENOUS
  Filled 2014-08-31 (×4): qty 40

## 2014-08-31 MED ORDER — INSULIN ASPART 100 UNIT/ML ~~LOC~~ SOLN
0.0000 [IU] | SUBCUTANEOUS | Status: DC
  Administered 2014-08-31: 2 [IU] via SUBCUTANEOUS
  Administered 2014-08-31 – 2014-09-01 (×3): 1 [IU] via SUBCUTANEOUS
  Administered 2014-09-01: 2 [IU] via SUBCUTANEOUS
  Administered 2014-09-02: 1 [IU] via SUBCUTANEOUS
  Administered 2014-09-02: 2 [IU] via SUBCUTANEOUS
  Administered 2014-09-02 (×2): 1 [IU] via SUBCUTANEOUS
  Administered 2014-09-02 (×2): 2 [IU] via SUBCUTANEOUS
  Administered 2014-09-02: 1 [IU] via SUBCUTANEOUS

## 2014-08-31 MED ORDER — LEVOTHYROXINE SODIUM 100 MCG IV SOLR
87.5000 ug | Freq: Every day | INTRAVENOUS | Status: DC
  Administered 2014-08-31 – 2014-09-02 (×3): 87.5 ug via INTRAVENOUS
  Filled 2014-08-31 (×4): qty 5

## 2014-08-31 MED ORDER — ALBUTEROL SULFATE (2.5 MG/3ML) 0.083% IN NEBU
2.5000 mg | INHALATION_SOLUTION | Freq: Four times a day (QID) | RESPIRATORY_TRACT | Status: DC
  Administered 2014-08-31 – 2014-09-03 (×12): 2.5 mg via RESPIRATORY_TRACT
  Filled 2014-08-31 (×11): qty 3

## 2014-08-31 MED ORDER — MORPHINE SULFATE 2 MG/ML IJ SOLN
1.0000 mg | INTRAMUSCULAR | Status: DC | PRN
  Administered 2014-08-31 (×2): 1 mg via INTRAVENOUS
  Filled 2014-08-31 (×3): qty 1

## 2014-08-31 NOTE — Progress Notes (Signed)
Unable to do chest vest at this time as pt states she cannot breathe and is anxious. Pt going back on bipap at this time

## 2014-08-31 NOTE — Progress Notes (Signed)
[facility] Progress Note Patient Name: Sara [name] DOB: [birth year] MRN: [id number]   Date of Service  08/31/2014  HPI/Events of Note  Hypoxic- brady   eICU Interventions  Placed on bipap CXR - rt white out - collapse Add vest/ may need NTS ? Bronchoscopy      Intervention Category Major Interventions: Respiratory failure - evaluation and management  ALVA,RAKESH V. 08/31/2014, 5:52 AM

## 2014-08-31 NOTE — Care Management Note (Signed)
    Page 1 of 1   08/31/2014     1:37:14 PM CARE MANAGEMENT NOTE 08/31/2014  Patient:  Sara [name], Sara [name]   Account Number:  [id number]  Date Initiated:  08/31/2014  Documentation initiated by:  Cleve Paolillo  Subjective/Objective Assessment:   dx GI Bleed/anemia; resident of [facility]    PCP  Yaakov Guthrie     Anticipated DC Date:  2014/09/21   Anticipated DC Plan:  [place] referral  Clinical Social Worker      DC Planning Services  CM consult      Status of service:  In process, will continue to follow Medicare Important Message given?  YES (If response is "NO", the following Medicare IM given date fields will be blank) Date Medicare IM given:  08/31/2014 Medicare IM given by:  Chaston Bradburn Date Additional Medicare IM given:   Additional Medicare IM given by:    Per UR Regulation:  Reviewed for med. necessity/level of care/duration of stay  Comments:  08/31/14 [place] PT recommends SNF for rehab, pt agrees and requests [facility].  Will notify CSW.  Pt is concerned about her financial situation as she is paying out-of-pocket for ALF and states she is running out of money.  Advised her to talk with DSS re qualification for MCD.

## 2014-08-31 NOTE — Progress Notes (Signed)
Called [facility] spoke with Dr. Elsworth Soho.  Advised him that pt complaining she cannot breath even after PRN breathing treatment.  Advised xray had been called to have morning xray completed.  Advised pt sating 100% on 2L [place].  Dr. Elsworth Soho advised to monitor at this time.  Will monitor pt closely.

## 2014-08-31 NOTE — Progress Notes (Signed)
Physical Therapy Treatment Patient Details Name: Sara [name] MRN: [id number] DOB: Feb 22, 1925 Today's Date: 08/31/2014    History of Present Illness 81 yo female from [place] with dyspnea, melena, hematemesis.    PT Comments    Limited by fatigue today, but able to tolerate bed mobility and transfer training with min-mod assist +2. VSS throughout and performed therapeutic exercises well. Patient will continue to benefit from skilled physical therapy services to further improve independence with functional mobility.   Follow Up Recommendations  SNF;Supervision/Assistance - 24 hour     Equipment Recommendations  None recommended by PT    Recommendations for Other Services       Precautions / Restrictions Precautions Precautions: Fall Restrictions Weight Bearing Restrictions: No    Mobility  Bed Mobility Overal bed mobility: Needs Assistance;+2 for physical assistance Bed Mobility: Supine to Sit     Supine to sit: Min assist;+2 for physical assistance;HOB elevated     General bed mobility comments: Min assist +2 for truncal and LE support out of bed. Use of bed pad to scoot forward to edge of bed. VC for technique. Use of bed rail.  Transfers Overall transfer level: Needs assistance Equipment used: Rolling walker (2 wheeled) Transfers: Sit to/from [facility] Sit to Stand: Mod assist;+2 physical assistance Stand pivot transfers: Min assist;+2 physical assistance       General transfer comment: Mod assist +2 for boost to stand from lowest bed setting. VC for hand placement. Min assist for pivot transfer with support for RW placement. Attempted to sit prematurely, needing cues to maintain upright posture and pivot further prior to sitting.  Ambulation/Gait                 Stairs            Wheelchair Mobility    Modified Rankin (Stroke Patients Only)       Balance                                    Cognition  Arousal/Alertness: Awake/alert Behavior During Therapy: WFL for tasks assessed/performed Overall Cognitive Status: Within Functional Limits for tasks assessed                      Exercises General Exercises - Lower Extremity Ankle Circles/Pumps: AROM;Both;10 reps;Seated Quad Sets: Strengthening;Both;10 reps;Seated Gluteal Sets: Strengthening;Both;10 reps;Seated    General Comments General comments (skin integrity, edema, etc.): Pt on BiPap      Pertinent Vitals/Pain Pain Assessment: No/denies pain  BP 146/70 SpO2 100% on bipap HR 99     Home Living                      Prior Function            PT Goals (current goals can now be found in the care plan section) Acute Rehab PT Goals PT Goal Formulation: With patient Time For Goal Achievement: 09/13/14 Potential to Achieve Goals: Fair Progress towards PT goals: Progressing toward goals    Frequency  Min 3X/week    PT Plan Current plan remains appropriate    Co-evaluation             End of Session Equipment Utilized During Treatment: Gait belt;Oxygen Activity Tolerance: Patient limited by fatigue Patient left: in chair;with call bell/phone within reach     Time: 1434-1449 PT Time Calculation (min) (ACUTE ONLY):  15 min  Charges:  $Therapeutic Activity: 8-22 mins                    G Codes:      Ellouise Newer 09/17/14, 4:28 PM Camille Bal [place], [place]

## 2014-08-31 NOTE — Evaluation (Signed)
Occupational Therapy Evaluation Patient Details Name: Sara [name] MRN: [id number] DOB: Mar 15, 1925 Today's Date: 08/31/2014    History of Present Illness This 81 y.o female admitted from [facility] with dyspnea, melena, hematemesis.  EGD >> esophageal varices, mild portal gastropahthy.  Pt developed mucous plug 08/31/13 requiring BIPAP.  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 She demonstrates the below listed deficits and will benefit from continued OT to maximize safety and independence with BADLs.  Pt presents to OT with generalized weakness and deconditioning.  She fatigues quite quickly with activity, and was only able to tolerate UE resisted exercise this pm.  Currently, she requires mod - total A with BADLs.  She will need SNF level rehab prior to returning to ALF.       Follow Up Recommendations  SNF    Equipment Recommendations  None recommended by OT    Recommendations for Other Services       Precautions / Restrictions Precautions Precautions: Fall Restrictions Weight Bearing Restrictions: No      Mobility Bed Mobility Overal bed mobility: Needs Assistance;+2 for physical assistance Bed Mobility: Supine to Sit     Supine to sit: Min assist;+2 for physical assistance;HOB elevated     General bed mobility comments: Pt sitting up in chair  Transfers Overall transfer level: Needs assistance Equipment used: Rolling walker (2 wheeled) Transfers: Sit to/from [facility] Sit to Stand: Mod assist;+2 physical assistance Stand pivot transfers: Min assist;+2 physical assistance       General transfer comment: Pt too fatigued to attempt    Balance                                            ADL Overall ADL's : Needs assistance/impaired Eating/Feeding: Set up;Sitting;Bed level   Grooming: Wash/dry hands;Wash/dry face;Oral  care;Brushing hair;Set up;Sitting   Upper Body Bathing: Moderate assistance;Sitting   Lower Body Bathing: Total assistance;Sit to/from stand   Upper Body Dressing : Maximal assistance;Sitting   Lower Body Dressing: Total assistance;Sit to/from stand   Toilet Transfer: Total assistance Toilet Transfer Details (indicate cue type and reason): Pt too fatigued to attempt  Toileting- Clothing Manipulation and Hygiene: Total assistance       Functional mobility during ADLs: Moderate assistance;+2 for physical assistance General ADL Comments: Pt fatigued, but was agreeable to exercise with OT     Vision                     Perception     Praxis      Pertinent Vitals/Pain Pain Assessment: No/denies pain     Hand Dominance     Extremity/Trunk Assessment Upper Extremity Assessment Upper Extremity Assessment: Generalized weakness   Lower Extremity Assessment Lower Extremity Assessment: Defer to PT evaluation   Cervical / Trunk Assessment Cervical / Trunk Assessment: Kyphotic   Communication Communication Communication: No difficulties   Cognition Arousal/Alertness: Awake/alert Behavior During Therapy: WFL for tasks assessed/performed Overall Cognitive Status: Within Functional Limits for tasks assessed                     General Comments       Exercises Exercises: General Upper Extremity     Shoulder Instructions      Home Living Family/patient expects to be  discharged to:: Assisted living                             Home Equipment: Gilford Rile - 2 wheels;Bedside commode;Wheelchair - manual          Prior Functioning/Environment Level of Independence: Needs assistance  Gait / Transfers Assistance Needed: mod I with ambulation with RW, transfers  ADL's / Homemaking Assistance Needed: mod I with bathing and dressing        OT Diagnosis: Generalized weakness   OT Problem List: Decreased strength;Decreased activity  tolerance;Impaired balance (sitting and/or standing);Decreased safety awareness;Decreased knowledge of use of DME or AE;Cardiopulmonary status limiting activity   OT Treatment/Interventions: Self-care/ADL training;Therapeutic exercise;Energy conservation;DME and/or AE instruction;Therapeutic activities;Patient/family education;Balance training    OT Goals(Current goals can be found in the care plan section) Acute Rehab OT Goals Patient Stated Goal: to get stronger  OT Goal Formulation: With patient Time For Goal Achievement: 09/14/14 Potential to Achieve Goals: Good ADL Goals Pt Will Perform Grooming: with min assist;standing Pt Will Perform Upper Body Bathing: with supervision;sitting Pt Will Perform Lower Body Bathing: with min assist;sit to/from stand Pt Will Perform Upper Body Dressing: with supervision;sitting Pt Will Perform Lower Body Dressing: with min assist;sit to/from stand Pt Will Transfer to Toilet: with min assist;ambulating;regular height toilet;bedside commode;grab bars Pt Will Perform Toileting - Clothing Manipulation and hygiene: with min assist;sit to/from stand  OT Frequency: Min 2X/week   Barriers to D/C: Decreased caregiver support          Co-evaluation              End of Session Equipment Utilized During Treatment: Oxygen (BiPAP) Nurse Communication: Mobility status  Activity Tolerance: Patient limited by fatigue Patient left: in chair;with call bell/phone within reach   Time: 3267-1245 OT Time Calculation (min): 19 min Charges:  OT General Charges $OT Visit: 1 Procedure OT Evaluation $Initial OT Evaluation Tier I: 1 Procedure OT Treatments $Therapeutic Exercise: 8-22 mins G-Codes:    Torian Thoennes M 06-Sep-2014, 4:57 PM

## 2014-08-31 NOTE — Progress Notes (Signed)
PCCM Interval Progress Note  Asked to assess pt with bedside ultrasound for evaluation of right sided pleural effusion.  Pt sat up in bed with assistance of nursing staff.  She was initially on [place] but quickly became very dyspneic and stated that she could not breathe.  I helped pt back down and asked RT to place her back on BiPAP since she continued to complain of dyspnea.  After a few minutes of BiPAP, she reported some relief in dyspnea and felt more comfortable. She was unable to sit upright for me to place ultrasound probe on her back so I attempted to obtain images of her right pleural space as best I could with her lying on her left side.  Images were unfortunately not the best quality. See below for images of right pleural space.          Would likely benefit from right sided thoracentesis; however, given her level of SOB and discomfort, will defer for now.  If she remains stable through the course of the night and can be placed back on  (I anticipate that she will be able to  stop BiPAP once she gets some rest), should be OK to perform thoracentesis in AM of 01/06.   Montey Hora, [facility] Pgr: [phone]  or [phone] 08/31/2014, 8:12 PM

## 2014-08-31 NOTE — Progress Notes (Signed)
Patient ID: Sara [name], female   DOB: 1925/07/09, 81 y.o.   MRN: [id number]  [facility] Progress Note   Assessment/ Plan:   1. Acute blood loss anemia/GI bleed from esophageal varices status post EGD/banding and without any ongoing bleeding. Further management by gastroenterology and follow-up EGD planned in one month for possible repeat banding (she has cirrhosis likely secondary to NASH).  2.ESRD: Plans for hemodialysis Tomorrow per her usual schedule- heparin free. Volume does not appear to be a major problem (in reference to her right lung whiteout). 3. CKD-MBD:  currently off binders/pediatric, will reconcile medications when satisfactorily taking oral intake  5. Nutrition:  currently nothing by mouth because she is back on NIPPV 6. Hypertension: Hemodynamically stable, blood pressures appear to be acceptable. 7. Shortness of breath/ hypoxic respiratory failure: Currently on noninvasive positive pressure ventilation, further management per pulmonology.  Subjective:   Events from overnight noted-shortness of breath reported starting midnight and did not respond well to nebulizer treatments-chest x-ray done and patient placed on BiPAP. Chest x-ray reveals right lung whiteout    Objective:   BP 96/38 mmHg  Pulse 76  Temp(Src) 97.4 F (36.3 C) (Oral)  Resp 17  Ht 5\' 4"  (1.626 m)  Wt 93.9 kg (207 lb 0.2 oz)  BMI 35.52 kg/m2  SpO2 100%  Physical Exam: HFW:YOVZCHYIFOY on BiPAP DXA:JOINO regular in rate and rhythm Resp:Decreased breath sounds right side, no distinct rales/rhonchi  MVE:HMCN, obese, nontender Ext:No lower extremity edema   Labs: BMET  Recent Labs Lab 09/20/2014 0017 09/26/2014 0407 08/29/14 0810 08/30/14 1446 08/31/14 0417  NA 136 139 137 136 141  K 4.6 4.5 4.5 5.3* 4.1  CL 94* 96 97 97 101  CO2 23 24 25 23 29   GLUCOSE 340* 95 168* 189* 138*  BUN 97* 105* 135* 156* 60*  CREATININE 4.66* 4.80* 6.34* 8.35* 4.75*  CALCIUM 8.1* 8.1* 7.0*  6.4* 7.4*  PHOS 8.3* 8.2* 8.7* 10.8* 7.2*   CBC  Recent Labs Lab 09/15/2014 1207 08/29/14 0830 08/30/14 1400 08/31/14 0417  WBC 22.3* 20.0* 21.5* 6.7  HGB 7.7* 7.7* 8.0* 7.9*  HCT 23.7* 23.9* 25.2* 25.1*  MCV 101.7* 100.8* 104.1* 102.4*  PLT 56* 56* 57* 36*   Medications:    . sodium chloride   Intravenous Once  . sodium chloride   Intravenous Once  . sodium chloride   Intravenous Once  . allopurinol  300 mg Oral Daily  . calcium carbonate  1,000 mg of elemental calcium Oral QHS  . carvedilol  12.5 mg Oral BID WC  . cefTRIAXone (ROCEPHIN)  IV  1 g Intravenous Q24H  . cholecalciferol  2,000 Units Oral QHS  . glimepiride  1 mg Oral Q breakfast  . insulin aspart  0-9 Units Subcutaneous TID WC  . levothyroxine  175 mcg Oral QAC breakfast  . pantoprazole  40 mg Oral QAC breakfast  . sodium chloride  10-40 mL Intracatheter Q12H   Elmarie Shiley, MD 08/31/2014, 7:50 AM

## 2014-08-31 NOTE — Progress Notes (Signed)
Pt called out stating she felt like she needed a breathing treatment.  RT called and advised.

## 2014-08-31 NOTE — Progress Notes (Signed)
Pt still complaining that she cannot breath.  Pt noted to have ectopy and sat now dropping as low at 50% on 2L [place].  100% NRB placed.  Dr. Elsworth Soho called again and he cameraed in to assess.  He advised to start BiPap.  Xray to bedside.  Dr. Elsworth Soho reviewed xray.  Advised to continue BiPap for now.  Pt color improved.  Pt sating 100% on BiPap.  No ectopy noted at this time.  Will continue to monitor closely.

## 2014-08-31 NOTE — Progress Notes (Signed)
PULMONARY / CRITICAL CARE MEDICINE   Name: Sara [name] MRN: [id number] DOB: [birth year]    ADMISSION DATE:  09/24/2014  CHIEF COMPLAINT:  Short of breath  INITIAL PRESENTATION:  81 yo female from [place] with dyspnea, melena, hematemesis.  STUDIES:  1/02 EGD >> esophageal varices with red wale signs s/p banding, mild portal gastropathy  SIGNIFICANT EVENTS: 1/01 transfer to [facility], GI/renal consulted, 1 unit PRBC transfusion 1/04 1 unit PRBC in HD, GI s/o 1/05  Mucus plug, BiPAP, chest PT  SUBJECTIVE:  She was started on Bipap overnight.  VITAL SIGNS: Temp:  [96.6 F (35.9 C)-98.2 F (36.8 C)] 96.6 F (35.9 C) (01/05 0733) Pulse Rate:  [75-106] 76 (01/05 0733) Resp:  [11-27] 17 (01/05 0733) BP: (70-163)/(37-84) 96/38 mmHg (01/05 0733) SpO2:  [85 %-100 %] 100 % (01/05 0733) FiO2 (%):  [100 %] 100 % (01/05 0540) Weight:  [204 lb 9.4 oz (92.8 kg)-207 lb 0.2 oz (93.9 kg)] 207 lb 0.2 oz (93.9 kg) (01/05 0453) INTAKE / OUTPUT:  Intake/Output Summary (Last 24 hours) at 08/31/14 0833 Last data filed at 08/31/14 0700  Gross per 24 hour  Intake    615 ml  Output    -34 ml  Net    649 ml    PHYSICAL EXAMINATION: General: no distress Neuro: sleepy, normal strength HEENT: no stridor Cardiovascular: regular, no murmur Lungs: decreased BS on Rt, no wheeze on Lt Abdomen: soft, non tender Musculoskeletal: 1+ edema Skin: no rashes  LABS:  CBC  Recent Labs Lab 08/29/14 0830 08/30/14 1400 08/31/14 0417  WBC 20.0* 21.5* 6.7  HGB 7.7* 8.0* 7.9*  HCT 23.9* 25.2* 25.1*  PLT 56* 57* 36*   Coag's  Recent Labs Lab 08/29/2014 0017 09/13/2014 0807  APTT 24 27  INR 1.65* 1.53*   BMET  Recent Labs Lab 08/29/14 0810 08/30/14 1446 08/31/14 0417  NA 137 136 141  K 4.5 5.3* 4.1  CL 97 97 101  CO2 25 23 29   BUN 135* 156* 60*  CREATININE 6.34* 8.35* 4.75*  GLUCOSE 168* 189* 138*   Electrolytes  Recent Labs Lab 09/19/2014 0017 09/18/2014 0407 08/29/14 0810  08/30/14 1446 08/31/14 0417  CALCIUM 8.1* 8.1* 7.0* 6.4* 7.4*  MG 1.9 1.8  --   --   --   PHOS 8.3* 8.2* 8.7* 10.8* 7.2*   Sepsis Markers  Recent Labs Lab 09/02/2014 0016  LATICACIDVEN 3.8*   ABG  Recent Labs Lab 09/11/2014 2344  PHART 7.389  PCO2ART 45.0  PO2ART 30.0*   Liver Enzymes  Recent Labs Lab 08/29/2014 0017 08/29/14 0810 08/30/14 1446 08/31/14 0417  AST 27  --   --   --   ALT 30  --   --   --   ALKPHOS 94  --   --   --   BILITOT 0.6  --   --   --   ALBUMIN 2.4* 2.4* 2.5* 2.1*   Cardiac Enzymes  Recent Labs Lab 08/27/14 0017 09/08/2014 0206 09/06/2014 0807  TROPONINI 0.18* 0.18* 0.17*   Glucose  Recent Labs Lab 08/29/14 1616 08/29/14 1941 08/29/14 2314 08/30/14 0354 08/30/14 0740 08/30/14 2143  GLUCAP 113* 180* 132* 76 88 151*    Imaging No results found.   ASSESSMENT / PLAN:  PULMONARY A: Stridor ?from thyromegaly. Mucus plugging 1/05. ?hx of COPD. P:   Scheduled BD's, and add mucomyst Chest vest, flutter valve if able F/u CXR >> might need bronch Will check with b/s u/s to evaluate for effusion  also  CARDIOVASCULAR Rt femoral CVL 1/01 >> A:  Hypotension in setting of mucus plugging 1/05. Hx of HTN, HLD, CAD. P:  Hold norvasc, coreg  RENAL A:   Hx of ESRD. P:   HD per renal  GASTROINTESTINAL A:   Upper GI bleed from esophageal varices. 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 Hx of GERD. P:   NPO until respiratory status more stable Protonix  HEMATOLOGIC A:   Acute blood loss anemia with anemia of chronic disease. Thrombocytopenia. P:  F/u CBC SCD's for DVT prevention  INFECTIOUS A:   Post-EGD prophylaxis >> completed Abx 1/04. P:   Monitor off Abx  ENDOCRINE A:   Hx of hypothyroidism. Hx of DM type II with CKD. Hx of gout. P:   Hold Amaryl, Allopurinol while NPO SSI Synthroid  NEUROLOGIC A:   Hx of RLS. Deconditioning. P:   PT/OT consult  Goals of Care >> confirmed DNR/DNI  status with pt's daughter.  SUMMARY: She likely has mucus plug leading to respiratory failure.  Will try chest PT, bronchial hygiene, nebs >> if no improvement, then might need bronch >> would be risky in her clinical status.  CC time 35 minutes  Chesley Mires, MD [place] 08/31/2014, 8:33 AM Pager:  [phone] After 3pm call: [phone]

## 2014-09-01 ENCOUNTER — Inpatient Hospital Stay (HOSPITAL_COMMUNITY): Payer: Medicare Other

## 2014-09-01 DIAGNOSIS — T17500A Unspecified foreign body in bronchus causing asphyxiation, initial encounter: Secondary | ICD-10-CM | POA: Insufficient documentation

## 2014-09-01 DIAGNOSIS — J90 Pleural effusion, not elsewhere classified: Secondary | ICD-10-CM | POA: Insufficient documentation

## 2014-09-01 DIAGNOSIS — J9601 Acute respiratory failure with hypoxia: Secondary | ICD-10-CM

## 2014-09-01 DIAGNOSIS — J9809 Other diseases of bronchus, not elsewhere classified: Secondary | ICD-10-CM | POA: Insufficient documentation

## 2014-09-01 LAB — LACTATE DEHYDROGENASE, PLEURAL OR PERITONEAL FLUID: LD FL: 57 U/L — AB (ref 3–23)

## 2014-09-01 LAB — GLUCOSE, CAPILLARY
GLUCOSE-CAPILLARY: 107 mg/dL — AB (ref 70–99)
GLUCOSE-CAPILLARY: 111 mg/dL — AB (ref 70–99)
Glucose-Capillary: 106 mg/dL — ABNORMAL HIGH (ref 70–99)
Glucose-Capillary: 145 mg/dL — ABNORMAL HIGH (ref 70–99)
Glucose-Capillary: 187 mg/dL — ABNORMAL HIGH (ref 70–99)
Glucose-Capillary: 98 mg/dL (ref 70–99)

## 2014-09-01 LAB — BODY FLUID CELL COUNT WITH DIFFERENTIAL
EOS FL: 0 %
LYMPHS FL: 10 %
Monocyte-Macrophage-Serous Fluid: 77 % (ref 50–90)
Neutrophil Count, Fluid: 13 % (ref 0–25)
Total Nucleated Cell Count, Fluid: 352 cu mm (ref 0–1000)

## 2014-09-01 LAB — RENAL FUNCTION PANEL
Albumin: 2.4 g/dL — ABNORMAL LOW (ref 3.5–5.2)
Anion gap: 15 (ref 5–15)
BUN: 78 mg/dL — AB (ref 6–23)
CALCIUM: 7.3 mg/dL — AB (ref 8.4–10.5)
CHLORIDE: 101 meq/L (ref 96–112)
CO2: 24 mmol/L (ref 19–32)
CREATININE: 6.35 mg/dL — AB (ref 0.50–1.10)
GFR calc Af Amer: 6 mL/min — ABNORMAL LOW (ref >90)
GFR calc non Af Amer: 5 mL/min — ABNORMAL LOW (ref >90)
Glucose, Bld: 113 mg/dL — ABNORMAL HIGH (ref 70–99)
Phosphorus: 8.9 mg/dL — ABNORMAL HIGH (ref 2.3–4.6)
Potassium: 4.5 mmol/L (ref 3.5–5.1)
Sodium: 140 mmol/L (ref 135–145)

## 2014-09-01 LAB — CREATININE, FLUID (PLEURAL, PERITONEAL, JP DRAINAGE): Creat, Fluid: 4.5 mg/dL

## 2014-09-01 LAB — CBC
HEMATOCRIT: 27.8 % — AB (ref 36.0–46.0)
HEMOGLOBIN: 8.7 g/dL — AB (ref 12.0–15.0)
MCH: 32.6 pg (ref 26.0–34.0)
MCHC: 31.3 g/dL (ref 30.0–36.0)
MCV: 104.1 fL — ABNORMAL HIGH (ref 78.0–100.0)
PLATELETS: 46 10*3/uL — AB (ref 150–400)
RBC: 2.67 MIL/uL — AB (ref 3.87–5.11)
RDW: 23.9 % — AB (ref 11.5–15.5)
WBC: 10.5 10*3/uL (ref 4.0–10.5)

## 2014-09-01 LAB — GLUCOSE, SEROUS FLUID: GLUCOSE FL: 114 mg/dL

## 2014-09-01 LAB — PROTEIN, BODY FLUID: Total protein, fluid: <3 g/dL

## 2014-09-01 MED ORDER — MIDAZOLAM HCL 2 MG/2ML IJ SOLN: 2.0000 mg | Freq: Once | INTRAMUSCULAR | Status: DC

## 2014-09-01 MED ORDER — PHENYLEPHRINE HCL 0.25 % NA SOLN
1.0000 | Freq: Four times a day (QID) | NASAL | Status: DC | PRN
  Administered 2014-09-01: 1 via NASAL

## 2014-09-01 MED ORDER — FENTANYL CITRATE 0.05 MG/ML IJ SOLN
INTRAMUSCULAR | Status: AC
  Filled 2014-09-01: qty 2

## 2014-09-01 MED ORDER — MIDAZOLAM HCL 2 MG/2ML IJ SOLN
INTRAMUSCULAR | Status: AC
  Filled 2014-09-01: qty 2

## 2014-09-01 MED ORDER — LIDOCAINE HCL 2 % EX GEL
1.0000 "application " | Freq: Once | CUTANEOUS | Status: AC
  Administered 2014-09-01: 1 via TOPICAL

## 2014-09-01 MED ORDER — FENTANYL CITRATE 0.05 MG/ML IJ SOLN
100.0000 ug | Freq: Once | INTRAMUSCULAR | Status: AC
  Administered 2014-09-01: 100 ug via INTRAVENOUS

## 2014-09-01 MED ORDER — LIDOCAINE HCL (PF) 1 % IJ SOLN
6.0000 mL | Freq: Once | INTRAMUSCULAR | Status: AC
  Administered 2014-09-01: 6 mL

## 2014-09-01 MED ORDER — BUTAMBEN-TETRACAINE-BENZOCAINE 2-2-14 % EX AERO: 1.0000 | INHALATION_SPRAY | Freq: Once | CUTANEOUS | Status: DC

## 2014-09-01 NOTE — Progress Notes (Signed)
[facility] placed on bridge of pt nose and on cheeks under Bipap mask.  Foam pad placed under Bipap mask straps on top of head.

## 2014-09-01 NOTE — Procedures (Signed)
Patient seen on Hemodialysis. QB 400, UF goal 3L Treatment adjusted as needed.  Elmarie Shiley MD [facility]. Office # [phone] Pager # [phone] 7:57 AM

## 2014-09-01 NOTE — Procedures (Signed)
Bedside Bronchoscopy Procedure Note Sara [name] [id number] Jun 23, 1925  Procedure: Bronchoscopy Indications: Remove secretions  Procedure Details: ET Tube Size: none ET Tube secured at lip (cm): Bite block in place: Yes In preparation for procedure, Patient hyper-oxygenated with 100 % FiO2 Airway entered and the following bronchi were examined: RUL, RML, RLL, LUL and LLL.   Bronchoscope removed.  Placed back on BIPAP  Evaluation BP 122/43 mmHg  Pulse 88  Temp(Src) 97.1 F (36.2 C) (Axillary)  Resp 20  Ht 5\' 4"  (1.626 m)  Wt 209 lb 7 oz (95 kg)  BMI 35.93 kg/m2  SpO2 100% Breath Sounds:Rhonch O2 sats: stable Patient's Current Condition: stable Specimens:  None Complications: No apparent complications Patient did tolerate procedure well.   Kathie Dike 09/01/2014, 1:22 PM

## 2014-09-01 NOTE — Progress Notes (Signed)
Patient ID: Sara [name], female   DOB: Oct 25, 1924, 81 y.o.   MRN: [id number]  [facility] Progress Note   Assessment/ Plan:   1.Shortness of breath/ hypoxic respiratory failure: On BiPAP overnight, evaluation from yesterday noted-possible thoracentesis (right-sided) today-? Possible reaccumulation of pleural effusion from ascites in this patient with cirrhosis  2.ESRD: Ongoing hemodialysis at this time without any problems-continue to monitor-ultrafiltration goal of 3 L. 3. CKD-MBD: currently off binders-will reconcile medications when satisfactorily taking oral intake  5. Nutrition: currently nothing by mouth because she is back on NIPPV 6. Hypertension: Hemodynamically stable, blood pressures appear to be acceptable. 7. Acute blood loss anemia/GI bleed from esophageal varices status post EGD/banding and without any ongoing bleeding. Further management by gastroenterology and follow-up EGD planned in one month for possible repeat banding (she has cirrhosis likely secondary to NASH).    Subjective:   Events from overnight noted, remains on supplemental oxygen therapy with NIPPV   Objective:   BP 131/56 mmHg  Pulse 73  Temp(Src) 97.8 F (36.6 C) (Axillary)  Resp 17  Ht 5\' 4"  (1.626 m)  Wt 95.4 kg (210 lb 5.1 oz)  BMI 36.08 kg/m2  SpO2 100%  Physical Exam: Gen: appears to be comfortable on dialysis-on BiPAP  CVS: pulse regular in rate and rhythm, S1 and S2 normal Resp:clear to auscultation-no distinct rales/rhonchi. Diminished breath sounds right side  QDI:YMEB, flat, nontender Ext: No lower extremity edema  Labs: BMET  Recent Labs Lab 09/04/2014 0017 09/11/2014 0407 08/29/14 0810 08/30/14 1446 08/31/14 0417 09/01/14 0150  NA 136 139 137 136 141 140  K 4.6 4.5 4.5 5.3* 4.1 4.5  CL 94* 96 97 97 101 101  CO2 23 24 25 23 29 24   GLUCOSE 340* 95 168* 189* 138* 113*  BUN 97* 105* 135* 156* 60* 78*  CREATININE 4.66* 4.80* 6.34* 8.35* 4.75* 6.35*  CALCIUM  8.1* 8.1* 7.0* 6.4* 7.4* 7.3*  PHOS 8.3* 8.2* 8.7* 10.8* 7.2* 8.9*   CBC  Recent Labs Lab 08/29/14 0830 08/30/14 1400 08/31/14 0417 09/01/14 0150  WBC 20.0* 21.5* 6.7 10.5  HGB 7.7* 8.0* 7.9* 8.7*  HCT 23.9* 25.2* 25.1* 27.8*  MCV 100.8* 104.1* 102.4* 104.1*  PLT 56* 57* 36* 46*   Medications:    . sodium chloride   Intravenous Once  . sodium chloride   Intravenous Once  . acetylcysteine  3 mL Nebulization Q6H  . albuterol  2.5 mg Nebulization Q6H  . calcium carbonate  1,000 mg of elemental calcium Oral QHS  . insulin aspart  0-9 Units Subcutaneous 6 times per day  . levothyroxine  87.5 mcg Intravenous Daily  . pantoprazole (PROTONIX) IV  40 mg Intravenous Q24H  . sodium chloride  10-40 mL Intracatheter Q12H   Elmarie Shiley, MD 09/01/2014, 7:53 AM

## 2014-09-01 NOTE — Progress Notes (Signed)
PULMONARY / CRITICAL CARE MEDICINE   Name: LYNNLEY DODDRIDGE MRN: [id number] DOB: 01-24-1925    ADMISSION DATE:  09/02/2014  CHIEF COMPLAINT:  Short of breath  INITIAL PRESENTATION:  81 yo female from [place] with dyspnea, melena, hematemesis.  STUDIES:  1/02 EGD >> esophageal varices with red wale signs s/p banding, mild portal gastropathy  SIGNIFICANT EVENTS: 1/01 transfer to [facility], GI/renal consulted, 1 unit PRBC transfusion 1/04 1 unit PRBC in HD, GI s/o 1/05  Mucus plug, BiPAP, chest PT  SUBJECTIVE:  Remains on BiPAP.  Getting HD.  VITAL SIGNS: Temp:  [97 F (36.1 C)-98.6 F (37 C)] 97 F (36.1 C) (01/06 0742) Pulse Rate:  [73-100] 100 (01/06 1015) Resp:  [9-28] 19 (01/06 1015) BP: (79-160)/(36-104) 80/46 mmHg (01/06 1015) SpO2:  [100 %] 100 % (01/06 0815) FiO2 (%):  [40 %-80 %] 40 % (01/06 0815) Weight:  [210 lb 5.1 oz (95.4 kg)-213 lb 6.5 oz (96.8 kg)] 213 lb 6.5 oz (96.8 kg) (01/06 0742) INTAKE / OUTPUT:  Intake/Output Summary (Last 24 hours) at 09/01/14 1029 Last data filed at 08/31/14 2000  Gross per 24 hour  Intake     60 ml  Output      0 ml  Net     60 ml    PHYSICAL EXAMINATION: General: no distress Neuro: sleepy, normal strength HEENT: BiPAP mask on Cardiovascular: regular, no murmur Lungs: decreased BS on Rt, no wheeze on Lt Abdomen: soft, non tender Musculoskeletal: 1+ edema Skin: no rashes  LABS:  CBC  Recent Labs Lab 08/30/14 1400 08/31/14 0417 09/01/14 0150  WBC 21.5* 6.7 10.5  HGB 8.0* 7.9* 8.7*  HCT 25.2* 25.1* 27.8*  PLT 57* 36* 46*   Coag's  Recent Labs Lab 09/09/2014 0017 09/25/2014 0807  APTT 24 27  INR 1.65* 1.53*   BMET  Recent Labs Lab 08/30/14 1446 08/31/14 0417 09/01/14 0150  NA 136 141 140  K 5.3* 4.1 4.5  CL 97 101 101  CO2 23 29 24   BUN 156* 60* 78*  CREATININE 8.35* 4.75* 6.35*  GLUCOSE 189* 138* 113*   Electrolytes  Recent Labs Lab 09/07/2014 0017 09/18/2014 0407  08/30/14 1446 08/31/14 0417  09/01/14 0150  CALCIUM 8.1* 8.1*  < > 6.4* 7.4* 7.3*  MG 1.9 1.8  --   --   --   --   PHOS 8.3* 8.2*  < > 10.8* 7.2* 8.9*  < > = values in this interval not displayed. Sepsis Markers  Recent Labs Lab 09/16/2014 0016  LATICACIDVEN 3.8*   ABG  Recent Labs Lab 09/26/2014 2344  PHART 7.389  PCO2ART 45.0  PO2ART 30.0*   Liver Enzymes  Recent Labs Lab 08/28/2014 0017  08/30/14 1446 08/31/14 0417 09/01/14 0150  AST 27  --   --   --   --   ALT 30  --   --   --   --   ALKPHOS 94  --   --   --   --   BILITOT 0.6  --   --   --   --   ALBUMIN 2.4*  < > 2.5* 2.1* 2.4*  < > = values in this interval not displayed. Cardiac Enzymes  Recent Labs Lab 09/14/2014 0017 09/26/2014 0206 09/06/2014 0807  TROPONINI 0.18* 0.18* 0.17*   Glucose  Recent Labs Lab 08/30/14 0740 08/30/14 2143 08/31/14 1940 08/31/14 2342 09/01/14 0354 09/01/14 0802  GLUCAP 88 151* 110* 125* 106* 107*    Imaging Dg Chest  Port 1 View  08/31/2014   CLINICAL DATA:  Followup right pleural effusion.  EXAM: PORTABLE CHEST - 1 VIEW 1425 hr:  COMPARISON:  Portable chest x-ray earlier same date 0539 hr and dating back to 08/23/2014. CT chest 08/24/2014. Imaging prior to today was performed at [facility].  FINDINGS: Complete opacification of the right hemithorax without significant mediastinal shift. Left lung clear. No left pleural effusion. Cardiomediastinal silhouette incompletely imaged.  IMPRESSION: Complete opacification of the right hemithorax without significant mediastinal shift, therefore likely a combination of a large pleural effusion and right lung atelectasis. Left lung remains clear.   Electronically Signed   By: Evangeline Dakin M.D.   On: 08/31/2014 14:42   Dg Chest Port 1 View  08/31/2014   CLINICAL DATA:  81 year old female with respiratory distress. Recently transferred from [facility]. Initial encounter.  EXAM: PORTABLE CHEST - 1 VIEW  COMPARISON:  [facility]  exams 08/26/2014 and earlier.  FINDINGS: Portable AP upright view at 0539 hrs. Recurrent near complete opacification of the right hemi thorax. On 08/23/2014 and 08/24/2014 the patient had a large right pneumothorax at the outside institution, which was treated with thoracentesis on 08/25/2014. The left lung appears stable. Stable visualized mediastinal contours.  IMPRESSION: Complete opacification of the right hemi thorax since 08/26/2014, favor due to recurrent large right pleural effusion as was treated with thoracentesis on 08/25/2014.  Still, recommend ultrasound confirmation of drainable right pleural fluid prior to intervention.   Electronically Signed   By: Lars Pinks M.D.   On: 08/31/2014 09:02     ASSESSMENT / PLAN:  PULMONARY A: Stridor ?from thyromegaly. Mucus plugging and pleural effusion 1/05. ?hx of COPD. P:   Scheduled BD's Bronch 1/06 >> f/u CXR and then decide about need for thoracentesis PRN BiPAP  CARDIOVASCULAR Rt femoral CVL 1/01 >> A:  Hypotension in setting of mucus plugging 1/05. Hx of HTN, HLD, CAD. P:  Hold norvasc, coreg  RENAL A:   Hx of ESRD. P:   HD per renal  GASTROINTESTINAL A:   Upper GI bleed from esophageal varices. 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 Hx of GERD. P:   NPO until respiratory status more stable Protonix  HEMATOLOGIC A:   Acute blood loss anemia with anemia of chronic disease. Thrombocytopenia. P:  F/u CBC SCD's for DVT prevention  INFECTIOUS A:   Post-EGD prophylaxis >> completed Abx 1/04. P:   Monitor off Abx  ENDOCRINE A:   Hx of hypothyroidism. Hx of DM type II with CKD. Hx of gout. P:   Hold Amaryl, Allopurinol while NPO SSI Synthroid  NEUROLOGIC A:   Hx of RLS. Deconditioning. P:   PT/OT consult  Goals of Care >> DNR/DNI status.  SUMMARY: She will need bronch to assess for mucus plug >> then decide about need for thoracentesis.  Discuss plan with pt's son, Gwyndolyn Saxon, by  phone.  Reviewed bronchoscopy procedure with pt, and consent signed.  CC time 35 minutes  Chesley Mires, MD [place] 09/01/2014, 10:29 AM Pager:  [phone] After 3pm call: [phone]

## 2014-09-01 NOTE — Procedures (Signed)
Thoracentesis Procedure Note  Pre-operative Diagnosis: Right Pleural Effusion   Post-operative Diagnosis: same  Indications: Diagnostic sampling and therapeutic relief of dyspnea.   Procedure Details  Consent: Informed consent was obtained. Risks of the procedure were discussed including: infection, bleeding, pain, pneumothorax.  Under sterile conditions the patient was positioned. Betadine solution and sterile drapes were utilized.  1% buffered lidocaine was used to anesthetize the 7th rib space. Fluid was obtained without any difficulties and minimal blood loss.  A dressing was applied to the wound and wound care instructions were provided.   Findings 1450 ml of clear light yellow pleural fluid was obtained. A sample was sent to Pathology for cytogenetics, flow, and cell counts, as well as for infection analysis.  Complications:  None; patient tolerated the procedure well.          Condition: stable  Plan A follow up chest x-ray was ordered.  Procedure performed under direct supervision of Dr. Halford Chessman and with ultrasound guidance.   Post procedure [place] assessment with approximately 500+ ml residual fluid.    Noe Gens, NP-C [facility] Pgr: [phone] or [phone]  I was present for procedure.  Chesley Mires, MD [facility] Pulmonary/Critical Care 09/01/2014, 4:11 PM Pager:  [phone] After 3pm call: [phone]

## 2014-09-01 NOTE — Clinical Social Work Note (Signed)
Attempted to see patient, however procedure was being done, will try again later.  Jones Broom. Daniel, MSW, [place] 09/01/2014 1:17 PM

## 2014-09-01 NOTE — Procedures (Signed)
Bronchoscopy Procedure Note Sara [name] [id number] 06-06-1925  Procedure: Bronchoscopy Indications: Remove secretions  Procedure Details Consent: Risks of procedure as well as the alternatives and risks of each were explained to the (patient/caregiver).  Consent for procedure obtained. Time Out: Verified patient identification, verified procedure, site/side was marked, verified correct patient position, special equipment/implants available, medications/allergies/relevent history reviewed, required imaging and test results available.  Performed  Procedure performed in ICU.  She was given 2 mg versed, 100 mcg fentanyl.  Bronchoscope entered orally.  Vocal cord visualized with normal movement.  1% lidocaine for topical anesthesia of airways.  Bronchoscope entered into trachea.  There was collapse of trachea with exhalation.  Bronchoscope entered into lt main bronchus.  Lt upper, lingular, and lower lobes visualized.  Mucosa normal, and no endobronchial lesions.  Bronchoscope entered into Rt main bronchus.  Rt upper, middle, and lower lobes visualized.  There was white/yellow mucus plug blocking Rt lower lobe bronchus >> this was easily suctioned.  No endobronchial lesions noted, and mucosa appeared normal.  Bronchoscope withdrawn.  No specimens obtained.  She had transient hypoxia that resolved after placing NRB mask, and removal of bronchoscope.  Chesley Mires, MD [facility] Pulmonary/Critical Care 09/01/2014, 1:28 PM Pager:  [phone] After 3pm call: [phone]

## 2014-09-02 ENCOUNTER — Inpatient Hospital Stay (HOSPITAL_COMMUNITY): Payer: Medicare Other

## 2014-09-02 ENCOUNTER — Institutional Professional Consult (permissible substitution): Payer: Medicare Other | Admitting: Pulmonary Disease

## 2014-09-02 DIAGNOSIS — J948 Other specified pleural conditions: Secondary | ICD-10-CM

## 2014-09-02 LAB — RENAL FUNCTION PANEL
ALBUMIN: 2.2 g/dL — AB (ref 3.5–5.2)
Anion gap: 19 — ABNORMAL HIGH (ref 5–15)
BUN: 47 mg/dL — ABNORMAL HIGH (ref 6–23)
CO2: 21 mmol/L (ref 19–32)
CREATININE: 5.06 mg/dL — AB (ref 0.50–1.10)
Calcium: 7.5 mg/dL — ABNORMAL LOW (ref 8.4–10.5)
Chloride: 101 mEq/L (ref 96–112)
GFR calc non Af Amer: 7 mL/min — ABNORMAL LOW (ref >90)
GFR, EST AFRICAN AMERICAN: 8 mL/min — AB (ref >90)
Glucose, Bld: 137 mg/dL — ABNORMAL HIGH (ref 70–99)
Phosphorus: 7.8 mg/dL — ABNORMAL HIGH (ref 2.3–4.6)
Potassium: 4.3 mmol/L (ref 3.5–5.1)
Sodium: 141 mmol/L (ref 135–145)

## 2014-09-02 LAB — CBC
HCT: 26.2 % — ABNORMAL LOW (ref 36.0–46.0)
Hemoglobin: 8.1 g/dL — ABNORMAL LOW (ref 12.0–15.0)
MCH: 32.3 pg (ref 26.0–34.0)
MCHC: 30.9 g/dL (ref 30.0–36.0)
MCV: 104.4 fL — ABNORMAL HIGH (ref 78.0–100.0)
PLATELETS: 44 10*3/uL — AB (ref 150–400)
RBC: 2.51 MIL/uL — AB (ref 3.87–5.11)
RDW: 23.5 % — ABNORMAL HIGH (ref 11.5–15.5)
WBC: 13.4 10*3/uL — ABNORMAL HIGH (ref 4.0–10.5)

## 2014-09-02 LAB — GLUCOSE, CAPILLARY
GLUCOSE-CAPILLARY: 139 mg/dL — AB (ref 70–99)
Glucose-Capillary: 150 mg/dL — ABNORMAL HIGH (ref 70–99)
Glucose-Capillary: 185 mg/dL — ABNORMAL HIGH (ref 70–99)
Glucose-Capillary: 144 mg/dL — ABNORMAL HIGH (ref 70–99)
Glucose-Capillary: 133 mg/dL — ABNORMAL HIGH (ref 70–99)
Glucose-Capillary: 127 mg/dL — ABNORMAL HIGH (ref 70–99)
Glucose-Capillary: 127 mg/dL — ABNORMAL HIGH (ref 70–99)
Glucose-Capillary: 174 mg/dL — ABNORMAL HIGH (ref 70–99)
Glucose-Capillary: 182 mg/dL — ABNORMAL HIGH (ref 70–99)

## 2014-09-02 NOTE — Progress Notes (Signed)
Patient ID: Sara [name], female   DOB: May 17, 1925, 81 y.o.   MRN: [id number]  [facility] Progress Note   Assessment/ Plan:   1.Shortness of breath/ hypoxic respiratory failure: Underwent bronchoscopy yesterday that revealed some mucus plugging-also underwent right-sided thoracentesis with fluid sent for analysis (appears to be transudate). Remains dependent on BiPAP 2.ESRD: Will order for hemodialysis tomorrow per her usual outpatient schedule and challenge dry weight in order to minimize accumulation of effusions 3. CKD-MBD: currently off binders-will reconcile medications when satisfactorily taking oral intake  5. Nutrition: currently nothing by mouth because she is back on NIPPV 6. Hypertension: Hemodynamically stable, blood pressures appear to be acceptable. 7. Acute blood loss anemia/GI bleed from esophageal varices status post EGD/banding and without any ongoing bleeding. Further management by gastroenterology and follow-up EGD planned in one month for possible repeat banding (she has cirrhosis likely secondary to NASH).   Subjective:   Reports to be feeling thirsty and would like some ice. No acute events noted overnight.    Objective:   BP 125/40 mmHg  Pulse 87  Temp(Src) 98.1 F (36.7 C) (Axillary)  Resp 16  Ht 5\' 4"  (1.626 m)  Wt 92.9 kg (204 lb 12.9 oz)  BMI 35.14 kg/m2  SpO2 100%  Physical Exam: Gen: Appears to be comfortable resting in bed-on BiPAP CVS: Pulse regular in rate and rhythm, S1 and S2 normal Resp: Decreased breath sounds over bases otherwise clear Abd: Soft, nontender, obese Ext: No lower extremity edema  Labs: BMET  Recent Labs Lab 09/01/2014 0017 09/08/2014 0407 08/29/14 0810 08/30/14 1446 08/31/14 0417 09/01/14 0150 09/02/14 0400  NA 136 139 137 136 141 140 141  K 4.6 4.5 4.5 5.3* 4.1 4.5 4.3  CL 94* 96 97 97 101 101 101  CO2 23 24 25 23 29 24 21   GLUCOSE 340* 95 168* 189* 138* 113* 137*  BUN 97* 105* 135* 156* 60* 78*  47*  CREATININE 4.66* 4.80* 6.34* 8.35* 4.75* 6.35* 5.06*  CALCIUM 8.1* 8.1* 7.0* 6.4* 7.4* 7.3* 7.5*  PHOS 8.3* 8.2* 8.7* 10.8* 7.2* 8.9* 7.8*   CBC  Recent Labs Lab 08/30/14 1400 08/31/14 0417 09/01/14 0150 09/02/14 0400  WBC 21.5* 6.7 10.5 13.4*  HGB 8.0* 7.9* 8.7* 8.1*  HCT 25.2* 25.1* 27.8* 26.2*  MCV 104.1* 102.4* 104.1* 104.4*  PLT 57* 36* 46* 44*   Medications:    . albuterol  2.5 mg Nebulization Q6H  . butamben-tetracaine-benzocaine  1 spray Topical Once  . calcium carbonate  1,000 mg of elemental calcium Oral QHS  . insulin aspart  0-9 Units Subcutaneous 6 times per day  . levothyroxine  87.5 mcg Intravenous Daily  . midazolam  2 mg Intravenous Once  . pantoprazole (PROTONIX) IV  40 mg Intravenous Q24H  . sodium chloride  10-40 mL Intracatheter Q12H   Elmarie Shiley, MD 09/02/2014, 7:52 AM

## 2014-09-02 NOTE — Plan of Care (Signed)
Problem: Phase II Progression Outcomes Goal: Hemodynamically stable Outcome: Progressing BP drops during HD.  On Bipap for resp distress Goal: Progress activity as tolerated unless otherwise ordered Outcome: Not Progressing Deconditioned and resp issues Goal: Tolerating diet Outcome: Not Progressing NPO at this time

## 2014-09-02 NOTE — Progress Notes (Signed)
PULMONARY / CRITICAL CARE MEDICINE   Name: Sara [name] MRN: [id number] DOB: May 02, 1925    ADMISSION DATE:  08/30/2014  CHIEF COMPLAINT:  Short of breath  INITIAL PRESENTATION:  80 yo female from [place] with dyspnea, melena, hematemesis.  STUDIES:  1/02 EGD >> esophageal varices with red wale signs s/p banding, mild portal gastropathy  SIGNIFICANT EVENTS: 1/01 transfer to [facility], GI/renal consulted, 1 unit PRBC transfusion 1/04 1 unit PRBC in HD, GI s/o 1/05  Mucus plug, BiPAP, chest PT 1/6 bronch > RLL mucus plug removed; thora> 1.45 L transudate removed  SUBJECTIVE:  Resting comfortably this morning off of BIPAP  VITAL SIGNS: Temp:  [97 F (36.1 C)-98.1 F (36.7 C)] 97.4 F (36.3 C) (01/07 0758) Pulse Rate:  [82-113] 84 (01/07 0800) Resp:  [9-23] 9 (01/07 0800) BP: (63-137)/(30-107) 113/38 mmHg (01/07 0800) SpO2:  [98 %-100 %] 100 % (01/07 0800) FiO2 (%):  [40 %] 40 % (01/07 0800) Weight:  [92.9 kg (204 lb 12.9 oz)-95 kg (209 lb 7 oz)] 92.9 kg (204 lb 12.9 oz) (01/07 0500) INTAKE / OUTPUT:  Intake/Output Summary (Last 24 hours) at 09/02/14 0825 Last data filed at 09/01/14 1112  Gross per 24 hour  Intake      0 ml  Output   1500 ml  Net  -1500 ml    PHYSICAL EXAMINATION:  Gen: resting comfortably in bed HEENT: facial dressing over nose, eomi PULM: wheezing bilaterally, upper airway stridor (mild), speaking comfortably CV: RRR no mgr AB: bS+, soft, Ext: edema noted, R fem CVL Neuro: A&Ox4, maew  LABS:  CBC  Recent Labs Lab 08/31/14 0417 09/01/14 0150 09/02/14 0400  WBC 6.7 10.5 13.4*  HGB 7.9* 8.7* 8.1*  HCT 25.1* 27.8* 26.2*  PLT 36* 46* 44*   Coag's  Recent Labs Lab 09/25/2014 0017 09/02/2014 0807  APTT 24 27  INR 1.65* 1.53*   BMET  Recent Labs Lab 08/31/14 0417 09/01/14 0150 09/02/14 0400  NA 141 140 141  K 4.1 4.5 4.3  CL 101 101 101  CO2 29 24 21   BUN 60* 78* 47*  CREATININE 4.75* 6.35* 5.06*  GLUCOSE 138* 113* 137*    Electrolytes  Recent Labs Lab 09/06/2014 0017 09/13/2014 0407  08/31/14 0417 09/01/14 0150 09/02/14 0400  CALCIUM 8.1* 8.1*  < > 7.4* 7.3* 7.5*  MG 1.9 1.8  --   --   --   --   PHOS 8.3* 8.2*  < > 7.2* 8.9* 7.8*  < > = values in this interval not displayed. Sepsis Markers  Recent Labs Lab 09/16/2014 0016  LATICACIDVEN 3.8*   ABG  Recent Labs Lab 09/12/2014 2344  PHART 7.389  PCO2ART 45.0  PO2ART 30.0*   Liver Enzymes  Recent Labs Lab 09/24/2014 0017  08/31/14 0417 09/01/14 0150 09/02/14 0400  AST 27  --   --   --   --   ALT 30  --   --   --   --   ALKPHOS 94  --   --   --   --   BILITOT 0.6  --   --   --   --   ALBUMIN 2.4*  < > 2.1* 2.4* 2.2*  < > = values in this interval not displayed. Cardiac Enzymes  Recent Labs Lab 09/10/2014 0017 09/07/2014 0206 08/30/2014 0807  TROPONINI 0.18* 0.18* 0.17*   Glucose  Recent Labs Lab 09/01/14 0802 09/01/14 1204 09/01/14 1602 09/01/14 1918 09/01/14 2333 09/02/14 0335  GLUCAP 107*  98 145* 111* 187* 133*    Imaging Dg Chest Port 1 View  09/01/2014   CLINICAL DATA:  Post thoracentesis  EXAM: PORTABLE CHEST - 1 VIEW  COMPARISON:  09/01/2014  FINDINGS: Cardiomediastinal silhouette is stable. Post thoracentesis improvement in aeration in right upper lobe and right perihilar. Small residual right pleural effusion with right basilar atelectasis or infiltrate. No pneumothorax. Left lung is clear.  IMPRESSION: Improvement in aeration in right upper lobe. Small residual right pleural effusion with right basilar atelectasis or infiltrate. No pneumothorax. Left lung is clear.   Electronically Signed   By: Lahoma Crocker M.D.   On: 09/01/2014 17:22   Dg Chest Port 1 View  09/01/2014   CLINICAL DATA:  Congestion  EXAM: PORTABLE CHEST - 1 VIEW  COMPARISON:  Yesterday  FINDINGS: Complete opacification of the right hemi thorax is stable. Left lung is clear. Cardiac silhouette is partially obscured. No pneumothorax.  IMPRESSION: Stable  opacification of the right hemi thorax. This likely a combination of pleural fluid, consolidation, and collapse.   Electronically Signed   By: Maryclare Bean M.D.   On: 09/01/2014 07:53     ASSESSMENT / PLAN:  PULMONARY A: Stridor ?from thyromegaly, comfortably breathing Mucus plugging and pleural effusion 1/05.> improved greatly after 1/6 bronch Pleural effusion> Favor hepatic hydrothorax;  Not sure what to make of elevated pleural Cr in absence of obstructive uropathy or recent renal procedure;   ?hx of COPD P:   Scheduled BD's Bronch 1/06 >> f/u CXR and then decide about need for thoracentesis PRN BiPAP F/U pleural studies  CARDIOVASCULAR Rt femoral CVL 1/01 >> A:  Hypotension in setting of mucus plugging 1/05> improved Hx of HTN, HLD, CAD P:  Continue to hold norvasc, coreg  RENAL A:   Hx of ESRD P:   HD per renal  GASTROINTESTINAL A:   Upper GI bleed from esophageal varices > not actively bleeding Cirrhosis likely from NASH with portal gastropathy Hx of GERD P:   Advance to clears 1/7 Protonix  HEMATOLOGIC A:   Acute blood loss anemia with anemia of chronic disease Thrombocytopenia P:  F/u CBC SCD's for DVT prevention  INFECTIOUS A:   Post-EGD prophylaxis >> completed Abx 1/04. P:   Monitor off Abx  ENDOCRINE A:   Hx of hypothyroidism Hx of DM type II with CKD Hx of gout P:   Hold Amaryl, Allopurinol while NPO SSI Synthroid  NEUROLOGIC A:   Hx of RLS Deconditioning P:   PT/OT consult Out of bed  Goals of Care >> DNR/DNI status.  SUMMARY: She is doing well post thoracentesis and bronchoscopy; Advance diet today if able to tolerate 2 hours off BIPAP Move to step down later today if stable   Roselie Awkward, MD [facility] Pager: [phone] Cell: [phone] If no response, call [phone]

## 2014-09-02 NOTE — Progress Notes (Signed)
Occupational Therapy Treatment Patient Details Name: Sara [name] MRN: [id number] DOB: 05/05/1925 Today's Date: 09/02/2014    History of present illness 81 yo female from [place] with dyspnea, melena, hematemesis.   OT comments  Pt was fatigued after working with PT, but agreeable to EOB activity with OT.  She does fatigue quickly, but was able to sit EOB unsupported today and perform UE exercise which is significant improvement for her.  VSS throughout.   Follow Up Recommendations  SNF    Equipment Recommendations  None recommended by OT    Recommendations for Other Services      Precautions / Restrictions Precautions Precautions: Fall Restrictions Weight Bearing Restrictions: No       Mobility Bed Mobility Overal bed mobility: Needs Assistance;+2 for physical assistance Bed Mobility: Supine to Sit     Supine to sit: Mod assist Sit to supine: Min assist   General bed mobility comments: Assist to guide LEs off EOB, and assist to lift trunk.  Required assist to lift LEs onto bed at end of session   Transfers Overall transfer level: Needs assistance Equipment used: Rolling walker (2 wheeled) Transfers: Sit to/from Stand Sit to Stand: Mod assist;+2 physical assistance Stand pivot transfers: Min assist;+2 physical assistance       General transfer comment: Pt too fatigued to attempt     Balance     Sitting balance-Leahy Scale: Fair                             ADL                                         General ADL Comments: Pt fatigued after working with PT, but agreeable to trying to work with OT.  Pt moved to EOB, sat EOB x 14 mins with supervision.  She performed UE exercise, but fatigued quickly.  Performed IS and flutter valve while EOB       Vision                     Perception     Praxis      Cognition   Behavior During Therapy: WFL for tasks assessed/performed Overall Cognitive Status: Within  Functional Limits for tasks assessed                       Extremity/Trunk Assessment               Exercises General Exercises - Upper Extremity Shoulder Flexion: AROM;Right;Left;10 reps;Seated Shoulder Extension: AROM;Right;Left;10 reps;Seated General Exercises - Lower Extremity Ankle Circles/Pumps: AROM;Both;Seated;20 reps Quad Sets: Strengthening;Both;Seated;20 reps Gluteal Sets: Strengthening;Both;10 reps;Seated Long Arc Quad: Strengthening;Both;20 reps;Seated Hip Flexion/Marching: Strengthening;Both;20 reps;Seated   Shoulder Instructions       General Comments      Pertinent Vitals/ Pain       Pain Assessment: No/denies pain  Home Living                                          Prior Functioning/Environment              Frequency Min 2X/week     Progress Toward Goals  OT Goals(current goals can now be found in the  care plan section)  Progress towards OT goals: Progressing toward goals  ADL Goals Pt Will Perform Grooming: with min assist;standing Pt Will Perform Upper Body Bathing: with supervision;sitting Pt Will Perform Lower Body Bathing: with min assist;sit to/from stand Pt Will Perform Upper Body Dressing: with supervision;sitting Pt Will Perform Lower Body Dressing: with min assist;sit to/from stand Pt Will Transfer to Toilet: with min assist;ambulating;regular height toilet;bedside commode;grab bars Pt Will Perform Toileting - Clothing Manipulation and hygiene: with min assist;sit to/from stand  Plan Discharge plan remains appropriate    Co-evaluation                 End of Session Equipment Utilized During Treatment: Oxygen   Activity Tolerance Patient limited by fatigue   Patient Left in bed;with call bell/phone within reach   Nurse Communication Mobility status        Time: 0947-0962 OT Time Calculation (min): 18 min  Charges: OT General Charges $OT Visit: 1 Procedure OT  Treatments $Therapeutic Activity: 8-22 mins  Makinsley Schiavi M 09/02/2014, 5:08 PM

## 2014-09-02 NOTE — Progress Notes (Signed)
Patient taken off BiPAP at this time, per her request. Placed on 4 LNC, seems to be doing well. RT will continue to monitor.

## 2014-09-02 NOTE — Clinical Social Work Note (Signed)
Discussion with case manager about patient going to SNF once she is medically ready.  Case manager will also follow patient to see if he is eligible for LTACH depending on physician orders.  Jones Broom. [place], MSW, [place] 09/02/2014 5:36 PM

## 2014-09-02 NOTE — Progress Notes (Signed)
Physical Therapy Treatment Patient Details Name: Sara [name] MRN: [id number] DOB: 04/21/1925 Today's Date: 09/02/2014    History of Present Illness 81 yo female from [place] with dyspnea, melena, hematemesis.    PT Comments    Slowly progressing towards physical therapy goals. Limited by fatigue and weakness functionally, required mod assist +2 for transfer from chair to bed today. SpO2 100% on 3L supplemental O2 with HR 92-100 bpm throughout therapy session. Increased stridor towards end of therapy session (RN aware). Patient will continue to benefit from skilled physical therapy services to further improve independence with functional mobility.    Follow Up Recommendations  SNF;Supervision/Assistance - 24 hour     Equipment Recommendations  None recommended by PT    Recommendations for Other Services       Precautions / Restrictions Precautions Precautions: Fall Restrictions Weight Bearing Restrictions: No    Mobility  Bed Mobility Overal bed mobility: Needs Assistance;+2 for physical assistance Bed Mobility: Sit to Supine       Sit to supine: Min assist   General bed mobility comments: Min assist for LE support into bed +2 for equipment and safety. VC for technique.  Transfers Overall transfer level: Needs assistance Equipment used: Rolling walker (2 wheeled) Transfers: Sit to/from Stand Sit to Stand: Mod assist;+2 physical assistance Stand pivot transfers: Min assist;+2 physical assistance       General transfer comment: Two attempts to stand with +1 assist however pt too weak to perform, Required mod assist +2 for boost from reclining chair. Min assist for transfer with pivot to bed. Again pt attempted to sit prematurely despite cues and education for upright posture and safety. Assist with walker placement during pivot  Ambulation/Gait                 Stairs            Wheelchair Mobility    Modified Rankin (Stroke Patients Only)        Balance                                    Cognition Arousal/Alertness: Awake/alert Behavior During Therapy: WFL for tasks assessed/performed Overall Cognitive Status: Within Functional Limits for tasks assessed                      Exercises General Exercises - Lower Extremity Ankle Circles/Pumps: AROM;Both;Seated;20 reps Quad Sets: Strengthening;Both;Seated;20 reps Gluteal Sets: Strengthening;Both;10 reps;Seated Long Arc Quad: Strengthening;Both;20 reps;Seated Hip Flexion/Marching: Strengthening;Both;20 reps;Seated    General Comments        Pertinent Vitals/Pain Pain Assessment: No/denies pain  SpO2 100% 3L BP 132/50 Pulse 92    Home Living                      Prior Function            PT Goals (current goals can now be found in the care plan section) Acute Rehab PT Goals PT Goal Formulation: With patient Time For Goal Achievement: 09/13/14 Potential to Achieve Goals: Fair Progress towards PT goals: Progressing toward goals    Frequency  Min 3X/week    PT Plan Current plan remains appropriate    Co-evaluation             End of Session Equipment Utilized During Treatment: Gait belt;Oxygen Activity Tolerance: Patient limited by fatigue Patient left: with call bell/phone within reach;in bed  Time: 7989-2119 PT Time Calculation (min) (ACUTE ONLY): 29 min  Charges:  $Therapeutic Exercise: 8-22 mins $Therapeutic Activity: 8-22 mins                    G Codes:      Ellouise Newer 09/11/2014, 4:02 PM Camille Bal [place], [place]

## 2014-09-03 ENCOUNTER — Inpatient Hospital Stay (HOSPITAL_COMMUNITY): Payer: Medicare Other

## 2014-09-03 LAB — GLUCOSE, CAPILLARY: GLUCOSE-CAPILLARY: 98 mg/dL (ref 70–99)

## 2014-09-03 LAB — BASIC METABOLIC PANEL
ANION GAP: 12 (ref 5–15)
BUN: 58 mg/dL — AB (ref 6–23)
CHLORIDE: 100 meq/L (ref 96–112)
CO2: 27 mmol/L (ref 19–32)
Calcium: 7.4 mg/dL — ABNORMAL LOW (ref 8.4–10.5)
Creatinine, Ser: 6.23 mg/dL — ABNORMAL HIGH (ref 0.50–1.10)
GFR calc Af Amer: 6 mL/min — ABNORMAL LOW (ref >90)
GFR calc non Af Amer: 5 mL/min — ABNORMAL LOW (ref >90)
GLUCOSE: 100 mg/dL — AB (ref 70–99)
Potassium: 4 mmol/L (ref 3.5–5.1)
SODIUM: 139 mmol/L (ref 135–145)

## 2014-09-03 MED ORDER — RACEPINEPHRINE HCL 2.25 % IN NEBU
INHALATION_SOLUTION | RESPIRATORY_TRACT | Status: AC
  Filled 2014-09-03: qty 0.5

## 2014-09-04 NOTE — Discharge Summary (Signed)
NAMELASHONA, [name]             ACCOUNT NO.:  [id number]  MEDICAL RECORD NO.:  [id number]  LOCATION:                                 FACILITY:  PHYSICIAN:  Norlene Campbell, MDDATE OF BIRTH:  [birth year]  DATE OF ADMISSION: DATE OF DISCHARGE:                              DISCHARGE SUMMARY   DEATH NOTE:  DATE OF DEATH:  26-Sep-2014.  CAUSE OF DEATH: 1. Acute respiratory failure in the setting of volume overload. 2. Atelectasis. 3. Pleural effusion. 4. Pulmonary edema. 5. End-stage renal disease. 6. Cirrhosis with portal hypertension.  HOSPITAL COURSE:  This is an 81 year old female with end-stage renal disease and cirrhosis, who was initially admitted to [facility] on August 27, 2014, for evaluation of a GI bleed from esophageal varices.  This was treated with banding and the patient was relatively stable, but then she developed pleural effusions and shortness of breath.  She was eventually transferred to the intensive care unit, where she was treated with chest physiotherapy and mucolytic therapy for atelectasis.  However, this did not help and she remained on BiPAP for about 5 days.  She underwent a bronchoscopy for therapeutic aspiration of her lungs and a thoracentesis which improved her symptoms.  On 09/26/14, her chest x-ray showed marked improvement and her breathing had improved significantly.  However, unfortunately that evening, she suddenly developed the acute onset of shortness of breath, hypoxemia, and developed a PEA arrest.  Per the patient's prior stated wishes, CPR was not performed and the patient died peacefully in the ICU.          ______________________________ Norlene Campbell, MD     DBM/MEDQ  D:  2014/09/27  T:  09/04/2014  Job:  [id number]

## 2014-09-05 LAB — BODY FLUID CULTURE
CULTURE: NO GROWTH
Gram Stain: NONE SEEN

## 2014-09-16 ENCOUNTER — Other Ambulatory Visit: Payer: Self-pay | Admitting: Nurse Practitioner

## 2014-09-23 ENCOUNTER — Ambulatory Visit: Payer: Medicare Other | Admitting: Physician Assistant

## 2014-09-27 NOTE — Progress Notes (Addendum)
[facility] Progress Note Patient Name: Sara [name] DOB: 01-Jul-1925 MRN: [id number]   Date of Service  Sep 08, 2014  HPI/Events of Note  Called for brady-resp distress-sudden onset -pt called out 'cannot breathe' then unresponsive K4.0  eICU Interventions   Placed on O2 Rhythm varying between s tach & brady - then asystole, DNR confirmed Declared dead by bedside, family informed     Intervention Category Major Interventions: Arrhythmia - evaluation and management  Salvadore Valvano V. 09-08-2014, 5:04 AM

## 2014-09-27 NOTE — Progress Notes (Signed)
[name] called out and said she needed a breathing treatment. Went into patient's room to find her in moderate respiratory distress. [name] called, and she came immediately to bedside. Breathing treatment started. Patient's color noted to be pale/gray. Rhythm on monitor changing from SR in 80s, to ST in 100s with ectopy. Called e-link MD Dr. Elsworth Soho for assistance. Patient lungs upon auscultation very wheezy, air unable to be heard passing through upper airway. HR down into 20s, then asystole, then alternating between tachy and brady. Spoke with patient's son Will (who is her HPOA) and informed him of the situation. He said that her wishes were to be a DNR and he would abide by that. Comfort care provided to Sara [name]. Time of death called at 33 by this RN and Allen Derry RN.  Richarda Blade RN

## 2014-09-27 DEATH — deceased

## 2015-02-05 IMAGING — CR DG CHEST 1V PORT
1 series · 1 of 1 positions shown · non-contrast
Comparison: 04/30/2013.

CLINICAL DATA: End-stage renal disease post catheter placement.

PORTABLE CHEST - 1 VIEW

[AP]
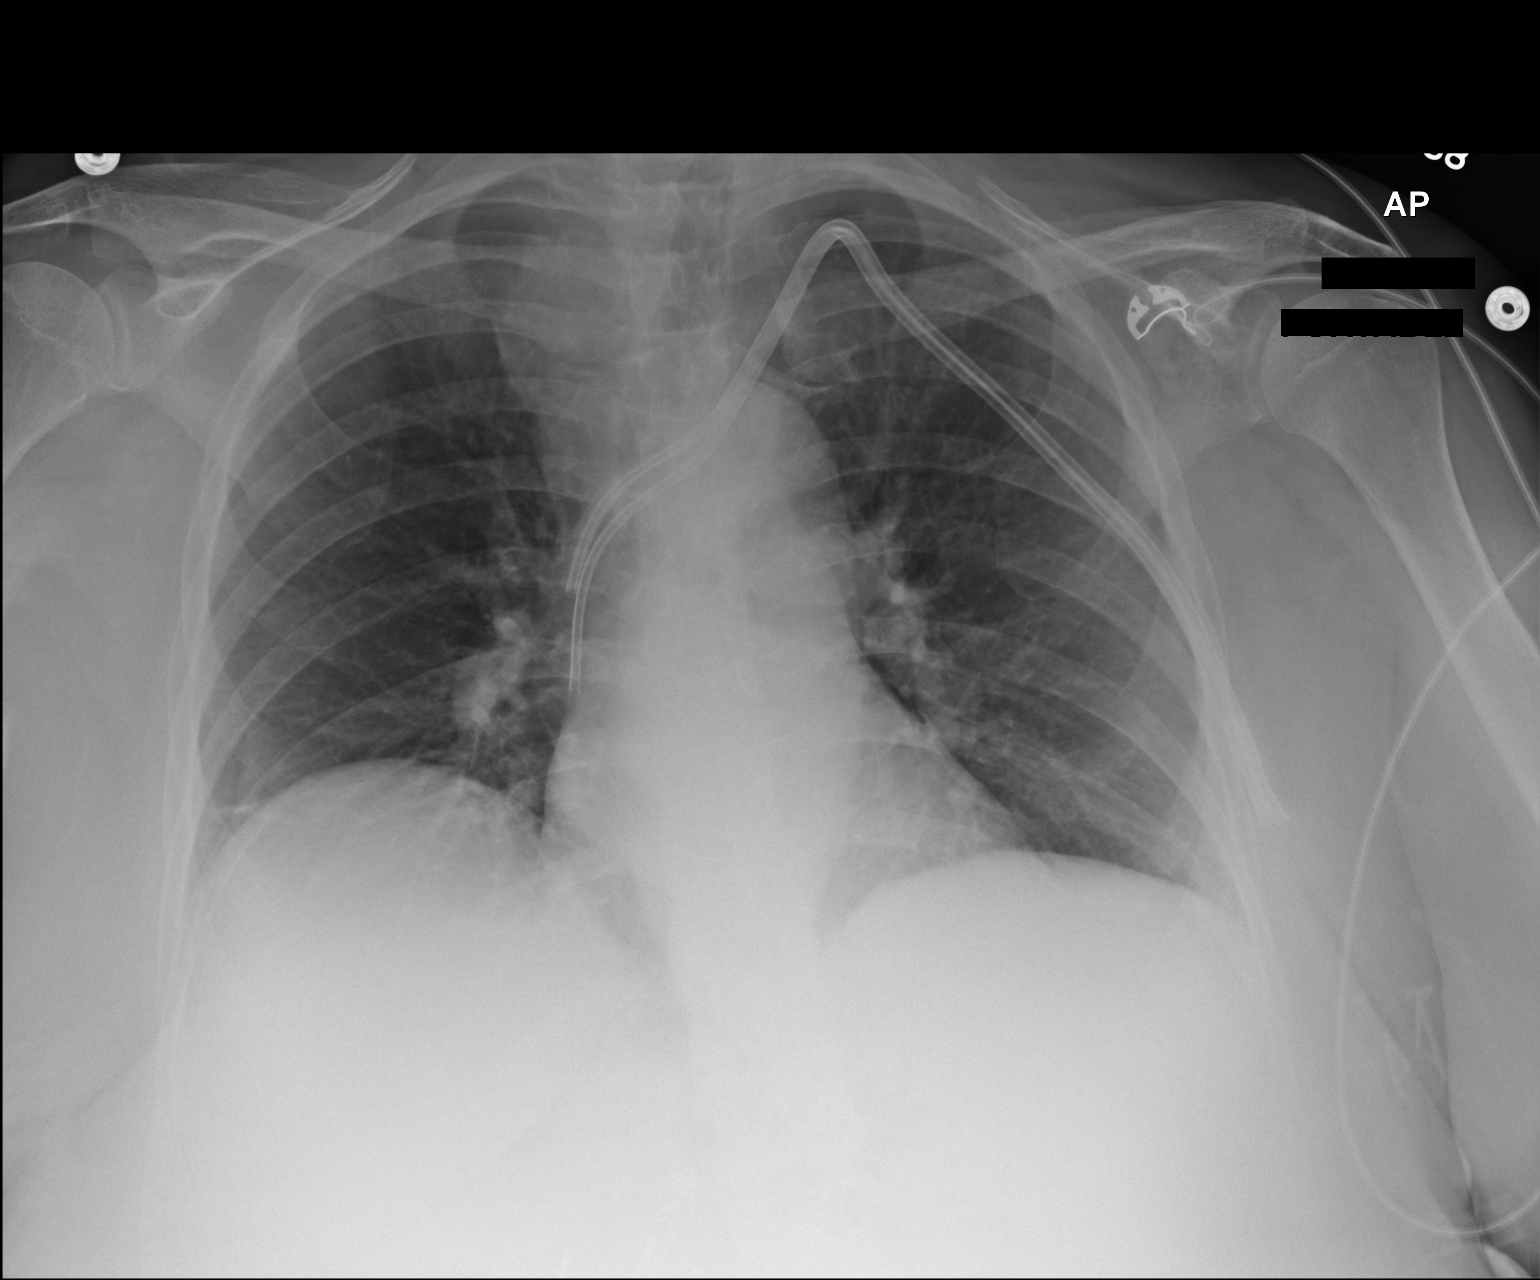

[1 of 1 positions shown; findings below may reference images not displayed]

FINDINGS: Left split type catheter has been placed with the tips in
the region of the mid to distal superior vena cava.  No gross
pneumothorax.

Fullness of the right paratracheal region.  This is new from the
recent examination and possibly related to projection however,
postprocedure hematoma not excluded particularly if a right-sided
central line attempt was performed.  This can be assessed on follow-
up.

Right base subsegmental atelectasis.

Mildly tortuous aorta.

Heart size within normal limits.
IMPRESSION: Left split type catheter has been placed with the tips in the
region of the mid to distal superior vena cava.  No gross
pneumothorax.

Fullness of the right paratracheal region.  This is new from the
recent examination and possibly related to projection however,
postprocedure hematoma not excluded particularly if a right-sided
central line attempt was performed.  This can be assessed on follow-
up.

This has been Maria Alejandra Rys report utilizing dashboard call
feature..

## 2015-02-05 IMAGING — CR DG CHEST 2V
2 series · 2 of 2 positions shown · non-contrast
Comparison: 12/21/2011

CLINICAL DATA: End-stage renal disease. Preop respiratory exam for
dialysis fistula.

EXAM:
CHEST  2 VIEW

[w chest pa]
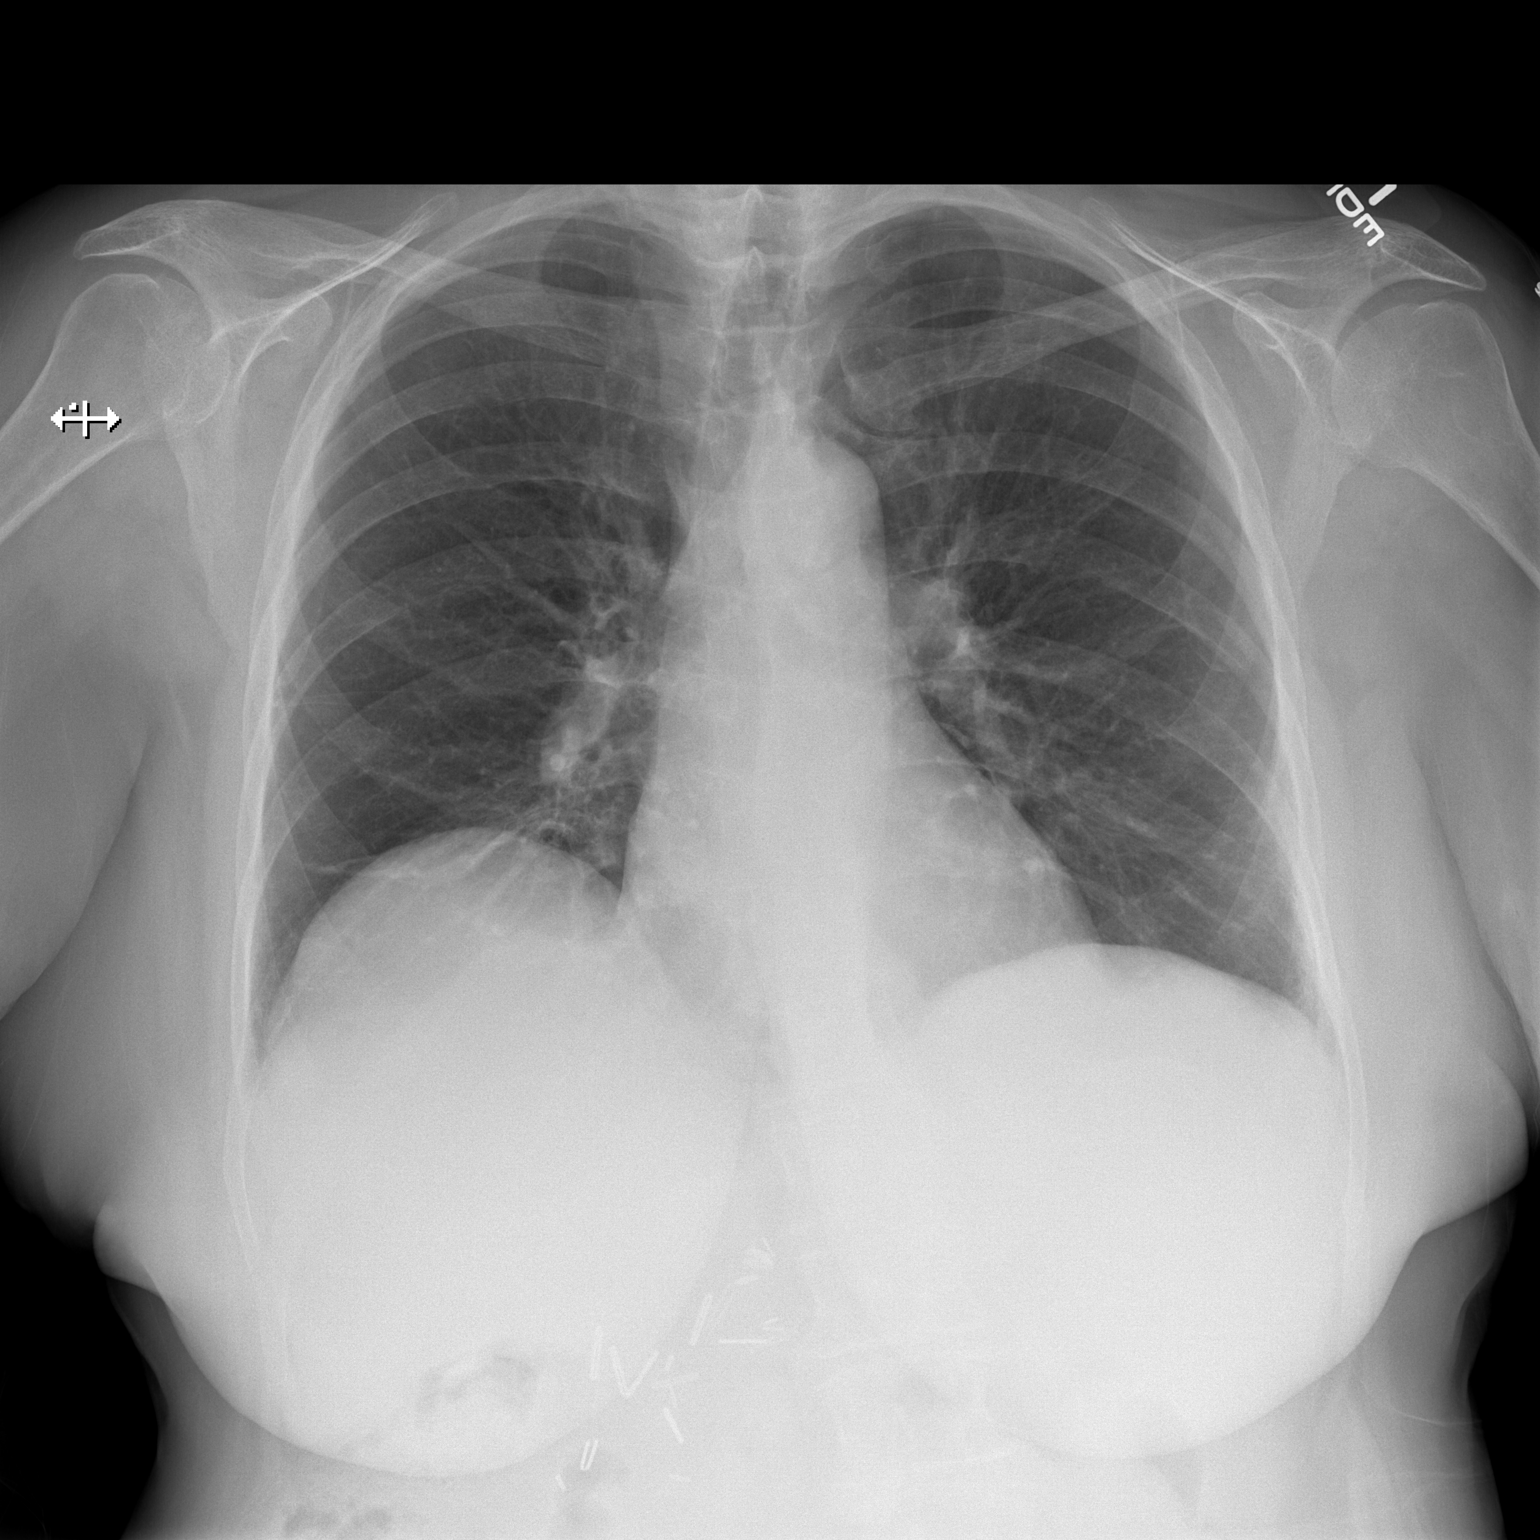

[w chest lat]
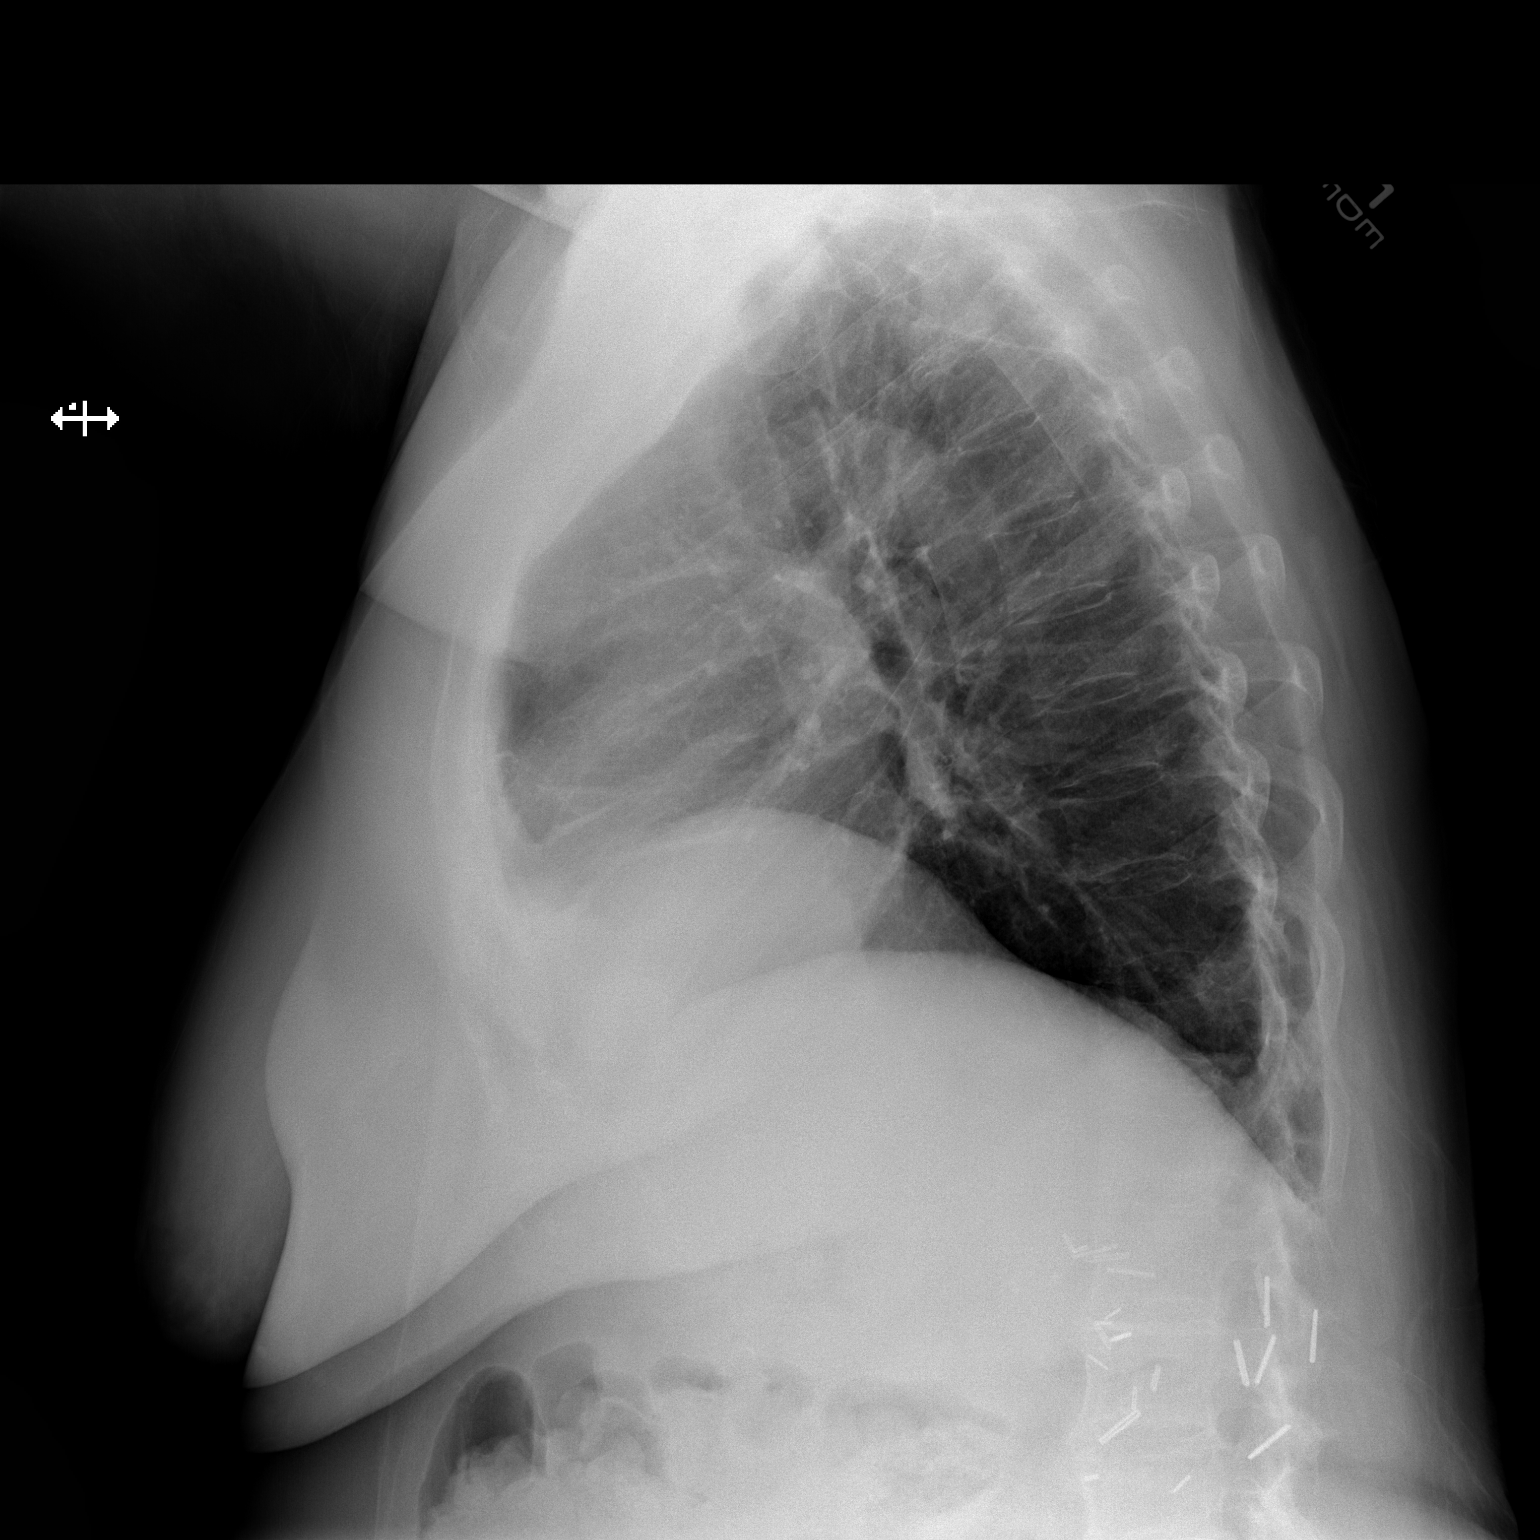

[2 of 2 positions shown; findings below may reference images not displayed]

FINDINGS: Heart size is normal. Mild scarring is seen in the inferior aspect
of the right middle lobe. No evidence of pulmonary infiltrate or
edema. No evidence of pleural effusion. No mass or lymphadenopathy
identified.
IMPRESSION: No active cardiopulmonary disease.

## 2016-06-07 IMAGING — CR DG CHEST 1V PORT
1 series · 1 of 1 positions shown · non-contrast
Comparison: [facility] [HOSPITAL] exams 08/26/2014 and
earlier.

CLINICAL DATA: 89-year-old female with respiratory distress.
Recently transferred from [facility]HOSPITAL]. Initial
encounter.

EXAM:
PORTABLE CHEST - 1 VIEW

[portable]
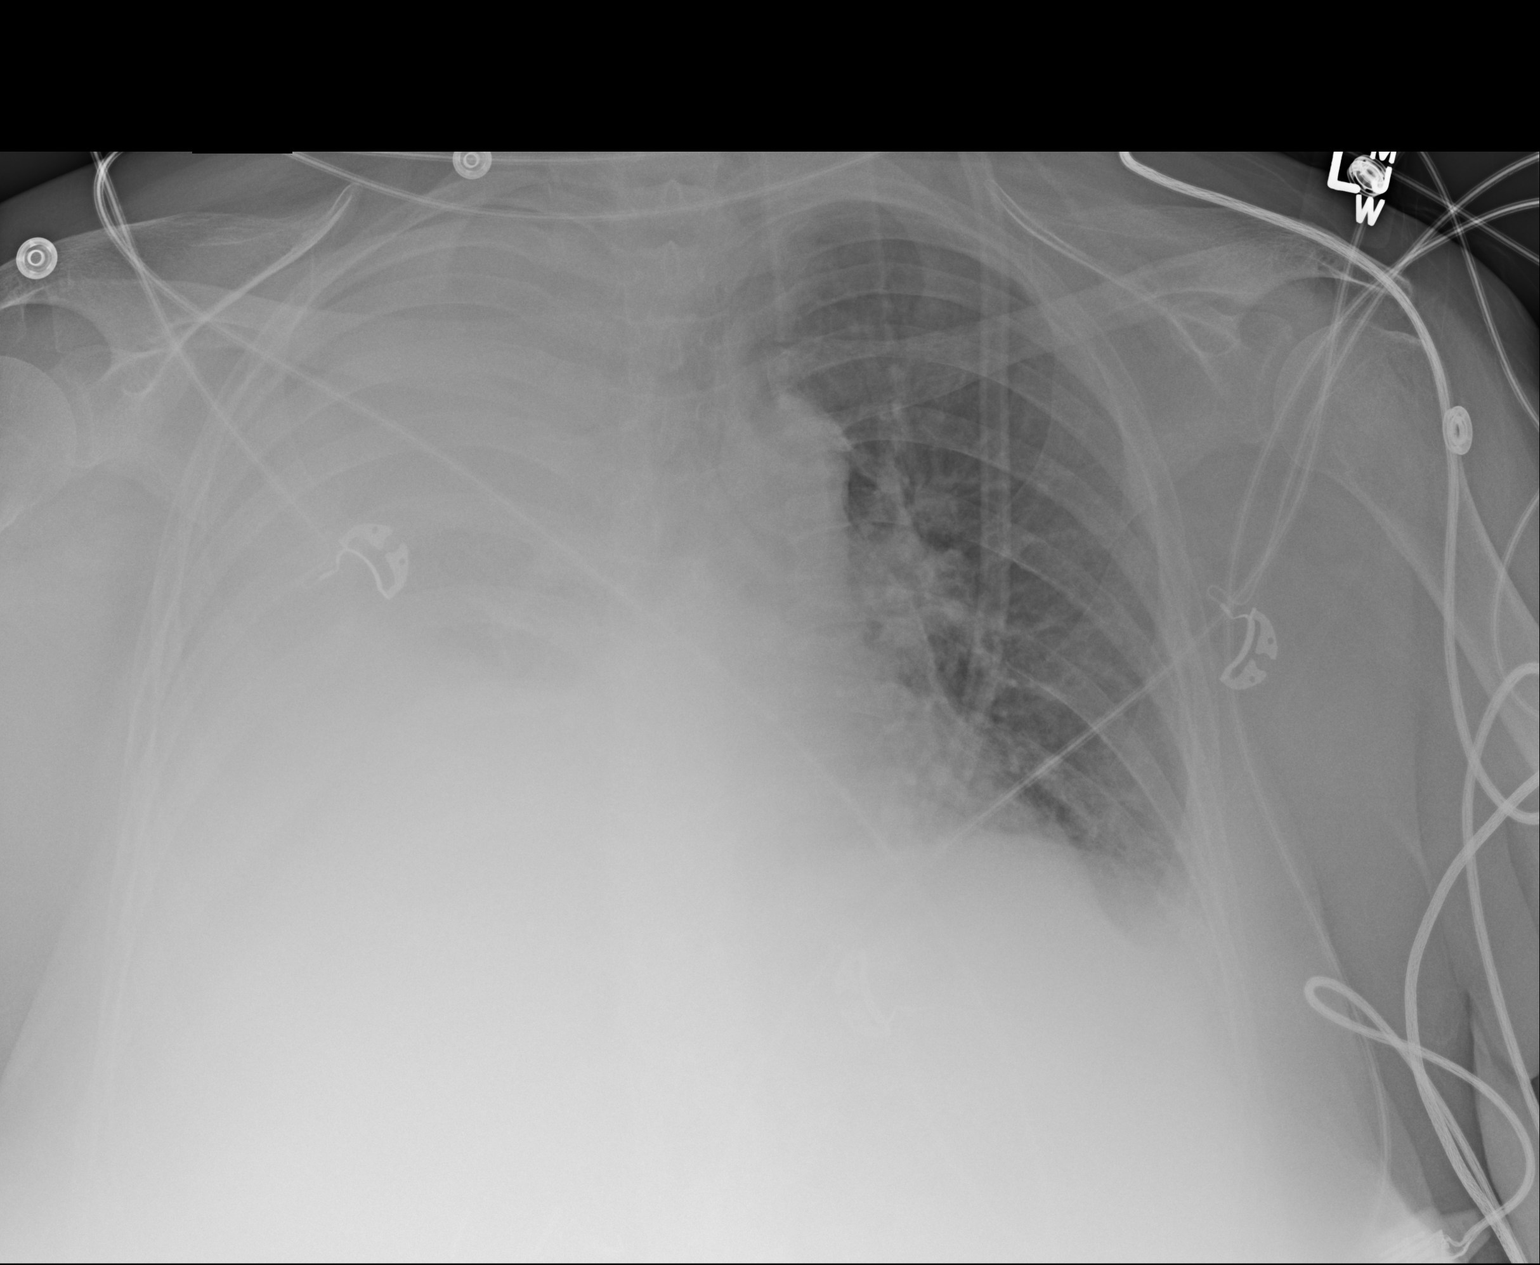

[1 of 1 positions shown; findings below may reference images not displayed]

FINDINGS: Portable AP upright view at 5073 hrs. Recurrent near complete
opacification of the right hemi thorax. On 08/23/2014 and 08/24/2014
the patient had a large right pneumothorax at the outside
institution, which was treated with thoracentesis on 08/25/2014. The
left lung appears stable. Stable visualized mediastinal contours.
IMPRESSION: Complete opacification of the right hemi thorax since 08/26/2014,
favor due to recurrent large right pleural effusion as was treated
with thoracentesis on 08/25/2014.

Still, recommend ultrasound confirmation of drainable right pleural
fluid prior to intervention.
